# Patient Record
Sex: Female | Born: 1995
Health system: Southern US, Community
[De-identification: ages and names within clinical notes are randomized; demographics above are authoritative.]

## PROBLEM LIST (undated history)

## (undated) DIAGNOSIS — J45909 Unspecified asthma, uncomplicated: Secondary | ICD-10-CM

## (undated) DIAGNOSIS — G5600 Carpal tunnel syndrome, unspecified upper limb: Secondary | ICD-10-CM

## (undated) DIAGNOSIS — O1490 Unspecified pre-eclampsia, unspecified trimester: Secondary | ICD-10-CM

## (undated) DIAGNOSIS — E282 Polycystic ovarian syndrome: Secondary | ICD-10-CM

## (undated) HISTORY — PX: WISDOM TOOTH EXTRACTION: SHX21

## (undated) HISTORY — DX: Unspecified pre-eclampsia, unspecified trimester: O14.90

---
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
## 2014-05-10 DIAGNOSIS — G5600 Carpal tunnel syndrome, unspecified upper limb: Secondary | ICD-10-CM

## 2014-05-10 HISTORY — DX: Carpal tunnel syndrome, unspecified upper limb: G56.00

## 2015-05-11 DIAGNOSIS — E282 Polycystic ovarian syndrome: Secondary | ICD-10-CM

## 2015-05-11 HISTORY — DX: Polycystic ovarian syndrome: E28.2

## 2018-03-16 ENCOUNTER — Ambulatory Visit: Payer: Self-pay | Admitting: Physician Assistant

## 2018-11-16 ENCOUNTER — Encounter (HOSPITAL_COMMUNITY): Payer: Self-pay | Admitting: *Deleted

## 2018-11-16 ENCOUNTER — Inpatient Hospital Stay (HOSPITAL_COMMUNITY)
Admission: AD | Admit: 2018-11-16 | Discharge: 2018-11-16 | Disposition: A | Discharge status: Home / Self Care | Payer: Medicaid Other | Attending: Obstetrics and Gynecology | Admitting: Obstetrics and Gynecology

## 2018-11-16 ENCOUNTER — Inpatient Hospital Stay (HOSPITAL_COMMUNITY): Payer: Medicaid Other

## 2018-11-16 DIAGNOSIS — Z880 Allergy status to penicillin: Secondary | ICD-10-CM | POA: Insufficient documentation

## 2018-11-16 DIAGNOSIS — R109 Unspecified abdominal pain: Secondary | ICD-10-CM

## 2018-11-16 DIAGNOSIS — R1013 Epigastric pain: Secondary | ICD-10-CM | POA: Insufficient documentation

## 2018-11-16 DIAGNOSIS — Z3A01 Less than 8 weeks gestation of pregnancy: Secondary | ICD-10-CM | POA: Insufficient documentation

## 2018-11-16 DIAGNOSIS — O26891 Other specified pregnancy related conditions, first trimester: Secondary | ICD-10-CM | POA: Diagnosis not present

## 2018-11-16 DIAGNOSIS — O99331 Smoking (tobacco) complicating pregnancy, first trimester: Secondary | ICD-10-CM | POA: Insufficient documentation

## 2018-11-16 DIAGNOSIS — O360910 Maternal care for other rhesus isoimmunization, first trimester, not applicable or unspecified: Secondary | ICD-10-CM

## 2018-11-16 DIAGNOSIS — O26899 Other specified pregnancy related conditions, unspecified trimester: Secondary | ICD-10-CM

## 2018-11-16 DIAGNOSIS — Z6790 Unspecified blood type, Rh positive: Secondary | ICD-10-CM

## 2018-11-16 HISTORY — DX: Unspecified asthma, uncomplicated: J45.909

## 2018-11-16 LAB — COMPREHENSIVE METABOLIC PANEL
ALT: 18 U/L (ref 0–44)
AST: 19 U/L (ref 15–41)
Albumin: 4.1 g/dL (ref 3.5–5.0)
Alkaline Phosphatase: 52 U/L (ref 38–126)
Anion gap: 12 (ref 5–15)
BUN: 5 mg/dL — ABNORMAL LOW (ref 6–20)
CO2: 20 mmol/L — ABNORMAL LOW (ref 22–32)
Calcium: 9 mg/dL (ref 8.9–10.3)
Chloride: 105 mmol/L (ref 98–111)
Creatinine, Ser: 0.64 mg/dL (ref 0.44–1.00)
GFR calc Af Amer: >60 mL/min (ref >60)
GFR calc non Af Amer: >60 mL/min (ref >60)
Glucose, Bld: 81 mg/dL (ref 70–99)
Potassium: 3.4 mmol/L — ABNORMAL LOW (ref 3.5–5.1)
Sodium: 137 mmol/L (ref 135–145)
Total Bilirubin: 0.7 mg/dL (ref 0.3–1.2)
Total Protein: 6.8 g/dL (ref 6.5–8.1)

## 2018-11-16 LAB — URINALYSIS, ROUTINE W REFLEX MICROSCOPIC
Bilirubin Urine: NEGATIVE
Glucose, UA: NEGATIVE mg/dL
Hgb urine dipstick: NEGATIVE
Ketones, ur: 20 mg/dL — AB
Leukocytes,Ua: NEGATIVE
Nitrite: NEGATIVE
Protein, ur: 30 mg/dL — AB
Specific Gravity, Urine: 1.026 (ref 1.005–1.030)
pH: 5 (ref 5.0–8.0)

## 2018-11-16 LAB — HCG, QUANTITATIVE, PREGNANCY: hCG, Beta Chain, Quant, S: 195214 m[IU]/mL — ABNORMAL HIGH (ref <5)

## 2018-11-16 LAB — ABO/RH: ABO/RH(D): O POS

## 2018-11-16 LAB — CBC
HCT: 37 % (ref 36.0–46.0)
Hemoglobin: 12.9 g/dL (ref 12.0–15.0)
MCH: 29.7 pg (ref 26.0–34.0)
MCHC: 34.9 g/dL (ref 30.0–36.0)
MCV: 85.1 fL (ref 80.0–100.0)
Platelets: 342 10*3/uL (ref 150–400)
RBC: 4.35 MIL/uL (ref 3.87–5.11)
RDW: 13.4 % (ref 11.5–15.5)
WBC: 13.1 10*3/uL — ABNORMAL HIGH (ref 4.0–10.5)
nRBC: 0 % (ref 0.0–0.2)

## 2018-11-16 LAB — AMYLASE: Amylase: 37 U/L (ref 28–100)

## 2018-11-16 LAB — POCT PREGNANCY, URINE: Preg Test, Ur: POSITIVE — AB

## 2018-11-16 LAB — LIPASE, BLOOD: Lipase: 24 U/L (ref 11–51)

## 2018-11-16 NOTE — Discharge Instructions (Signed)
Abdominal Pain During Pregnancy  Abdominal pain is common during pregnancy, and has many possible causes. Some causes are more serious than others, and sometimes the cause is not known. Abdominal pain can be a sign that labor is starting. It can also be caused by normal growth and stretching of muscles and ligaments during pregnancy. Always tell your health care provider if you have any abdominal pain. Follow these instructions at home:  Do not have sex or put anything in your vagina until your pain goes away completely.  Get plenty of rest until your pain improves.  Drink enough fluid to keep your urine pale yellow.  Take over-the-counter and prescription medicines only as told by your health care provider.  Keep all follow-up visits as told by your health care provider. This is important. Contact a health care provider if:  Your pain continues or gets worse after resting.  You have lower abdominal pain that: ? Comes and goes at regular intervals. ? Spreads to your back. ? Is similar to menstrual cramps.  You have pain or burning when you urinate. Get help right away if:  You have a fever or chills.  You have vaginal bleeding.  You are leaking fluid from your vagina.  You are passing tissue from your vagina.  You have vomiting or diarrhea that lasts for more than 24 hours.  Your baby is moving less than usual.  You feel very weak or faint.  You have shortness of breath.  You develop severe pain in your upper abdomen. Summary  Abdominal pain is common during pregnancy, and has many possible causes.  If you experience abdominal pain during pregnancy, tell your health care provider right away.  Follow your health care provider's home care instructions and keep all follow-up visits as directed. This information is not intended to replace advice given to you by your health care provider. Make sure you discuss any questions you have with your health care  provider. Document Released: 04/26/2005 Document Revised: 08/14/2018 Document Reviewed: 07/29/2016 Elsevier Patient Education  2020 [employer]                   Safe Medications in Pregnancy    Acne: Benzoyl Peroxide Salicylic Acid  Backache/Headache: Tylenol: 2 regular strength every 4 hours OR              2 Extra strength every 6 hours  Colds/Coughs/Allergies: Benadryl (alcohol free) 25 mg every 6 hours as needed Breath right strips Claritin Cepacol throat lozenges Chloraseptic throat spray Cold-Eeze- up to three times per day Cough drops, alcohol free Flonase (by prescription only) Guaifenesin Mucinex Robitussin DM (plain only, alcohol free) Saline nasal spray/drops Sudafed (pseudoephedrine) & Actifed ** use only after [redacted] weeks gestation and if you do not have high blood pressure Tylenol Vicks Vaporub Zinc lozenges Zyrtec   Constipation: Colace Ducolax suppositories Fleet enema Glycerin suppositories Metamucil Milk of magnesia Miralax Senokot Smooth move tea  Diarrhea: Kaopectate Imodium A-D  *NO pepto Bismol  Hemorrhoids: Anusol Anusol HC Preparation H Tucks  Indigestion: Tums Maalox Mylanta Zantac  Pepcid  Insomnia: Benadryl (alcohol free) 25mg  every 6 hours as needed Tylenol PM Unisom, no Gelcaps  Leg Cramps: Tums MagGel  Nausea/Vomiting:  Bonine Dramamine Emetrol Ginger extract Sea bands Meclizine  Nausea medication to take during pregnancy:  Unisom (doxylamine succinate 25 mg tablets) Take one tablet daily at bedtime. If symptoms are not adequately controlled, the dose can be increased to a maximum recommended dose of two  tablets daily (1/2 tablet in the morning, 1/2 tablet mid-afternoon and one at bedtime). Vitamin B6 100mg  tablets. Take one tablet twice a day (up to 200 mg per day).  Skin Rashes: Aveeno products Benadryl cream or 25mg  every 6 hours as needed Calamine Lotion 1% cortisone cream  Yeast  infection: Gyne-lotrimin 7 Monistat 7   **If taking multiple medications, please check labels to avoid duplicating the same active ingredients **take medication as directed on the label ** Do not exceed 4000 mg of tylenol in 24 hours **Do not take medications that contain aspirin or ibuprofen

## 2018-11-16 NOTE — MAU Provider Note (Signed)
History     CSN: [id number]  Arrival date and time: 11/16/18 1452   First Provider Initiated Contact with Patient 11/16/18 1607      Chief Complaint  Patient presents with  . Abdominal Pain   Ms. Christie LimerickHolly Yates is a 23 y.o. G1P0 at 1670w6d who presents to MAU for abdominal pain. Pt reports eating chicken pot pie for dinner last night. Pt denies hx of gallbladder issues/pancreatitis. Pt denies ever feeling this type of pain in the past.  Onset: last night around 1800 Location: epigastric region Duration: <24hrs Character: pain only present with palpation or forceful movements - "feels like a sharp pain of pressure," without palpation or force feels like a dull ache, intermittent Aggravating/Associated: palpation, forceful movements/none Relieving: rest Treatment: warm bath, pt reports helped a little Severity: 6/10 at worst, at rest 1/10  Pt denies VB, vaginal discharge/odor/itching. Pt denies N/V, constipation, diarrhea, or urinary problems. Pt denies fever, chills, fatigue, sweating or changes in appetite. Pt denies SOB or chest pain. Pt denies dizziness, HA, light-headedness, weakness.  Problems this pregnancy include: pt has not yet been seen. Allergies? PCN, sulfa Current medications/supplements? B6, PNVs Prenatal care provider? [facility], next appt 11/17/2018   OB History    Gravida  1   Para      Term      Preterm      AB      Living        SAB      TAB      Ectopic      Multiple      Live Births              Past Medical History:  Diagnosis Date  . Asthma   . Carpal tunnel syndrome 2016  . PCOS (polycystic ovarian syndrome) 2017    History reviewed. No pertinent surgical history.  Family History  Problem Relation Age of Onset  . Multiple sclerosis Mother   . Crohn's disease Mother   . Cancer Father     Social History   Tobacco Use  . Smoking status: Current Every Day Smoker  . Smokeless tobacco: Never Used  Substance Use Topics  .  Alcohol use: Never    Frequency: Never  . Drug use: Never    Allergies:  Allergies  Allergen Reactions  . Penicillins Other (See Comments)    Vomiting and pooping blood  . Sulfa Antibiotics Swelling    Medications Prior to Admission  Medication Sig Dispense Refill Last Dose  . Prenatal Vit-Fe Fumarate-FA (MULTIVITAMIN-PRENATAL) 27-0.8 MG TABS tablet Take 1 tablet by mouth daily at 12 noon.   11/15/2018 at Unknown time  . vitamin B-6 (PYRIDOXINE) 25 MG tablet Take 25 mg by mouth daily.   11/15/2018 at Unknown time    Review of Systems  Constitutional: Negative for chills, diaphoresis, fatigue and fever.  Respiratory: Negative for shortness of breath.   Cardiovascular: Negative for chest pain.  Gastrointestinal: Positive for abdominal pain. Negative for constipation, diarrhea, nausea and vomiting.  Genitourinary: Negative for dysuria, flank pain, frequency, pelvic pain, urgency, vaginal bleeding and vaginal discharge.  Neurological: Negative for dizziness, weakness, light-headedness and headaches.   Physical Exam   Blood pressure (!) 141/71, pulse 92, temperature 98.4 F (36.9 C), resp. rate 16, height 5\' 3"  (1.6 m), weight 95.7 kg, last menstrual period 09/22/2018.  Patient Vitals for the past 24 hrs:  BP Temp Pulse Resp Height Weight  11/16/18 1539 (!) 141/71 - 92 - - -  11/16/18 1518 (!) 144/78 98.4 F (36.9 C) 99 16 5\' 3"  (1.6 m) 95.7 kg   Physical Exam  Constitutional: She is oriented to person, place, and time. She appears well-developed and well-nourished. No distress.  HENT:  Head: Normocephalic and atraumatic.  Respiratory: Effort normal.  GI: Soft. She exhibits no distension and no mass. There is abdominal tenderness in the epigastric area. There is no rigidity, no rebound, no guarding, no tenderness at McBurney's point and negative Murphy's sign.  Neurological: She is alert and oriented to person, place, and time.  Skin: Skin is warm and dry. She is not diaphoretic.   Psychiatric: She has a normal mood and affect. Her behavior is normal. Judgment and thought content normal.   Results for orders placed or performed during the hospital encounter of 11/16/18 (from the past 24 hour(s))  Pregnancy, urine POC     Status: Abnormal   Collection Time: 11/16/18  3:25 PM  Result Value Ref Range   Preg Test, Ur POSITIVE (A) NEGATIVE  Urinalysis, Routine w reflex microscopic     Status: Abnormal   Collection Time: 11/16/18  3:29 PM  Result Value Ref Range   Color, Urine YELLOW YELLOW   APPearance CLEAR CLEAR   Specific Gravity, Urine 1.026 1.005 - 1.030   pH 5.0 5.0 - 8.0   Glucose, UA NEGATIVE NEGATIVE mg/dL   Hgb urine dipstick NEGATIVE NEGATIVE   Bilirubin Urine NEGATIVE NEGATIVE   Ketones, ur 20 (A) NEGATIVE mg/dL   Protein, ur 30 (A) NEGATIVE mg/dL   Nitrite NEGATIVE NEGATIVE   Leukocytes,Ua NEGATIVE NEGATIVE   RBC / HPF 0-5 0 - 5 RBC/hpf   WBC, UA 0-5 0 - 5 WBC/hpf   Bacteria, UA RARE (A) NONE SEEN   Squamous Epithelial / LPF 0-5 0 - 5   Mucus PRESENT   CBC     Status: Abnormal   Collection Time: 11/16/18  4:17 PM  Result Value Ref Range   WBC 13.1 (H) 4.0 - 10.5 K/uL   RBC 4.35 3.87 - 5.11 MIL/uL   Hemoglobin 12.9 12.0 - 15.0 g/dL   HCT 40.937.0 81.136.0 - 91.446.0 %   MCV 85.1 80.0 - 100.0 fL   MCH 29.7 26.0 - 34.0 pg   MCHC 34.9 30.0 - 36.0 g/dL   RDW 78.213.4 95.611.5 - 21.315.5 %   Platelets 342 150 - 400 K/uL   nRBC 0.0 0.0 - 0.2 %  hCG, quantitative, pregnancy     Status: Abnormal   Collection Time: 11/16/18  4:17 PM  Result Value Ref Range   hCG, Beta Chain, Quant, S 195,214 (H) <5 mIU/mL  ABO/Rh     Status: None   Collection Time: 11/16/18  4:17 PM  Result Value Ref Range   ABO/RH(D) O POS    No rh immune globuloin      NOT A RH IMMUNE GLOBULIN CANDIDATE, PT RH POSITIVE Performed at [facility], [address] [place], [place] [address]   Comprehensive metabolic panel     Status: Abnormal   Collection Time: 11/16/18  4:17 PM  Result Value  Ref Range   Sodium 137 135 - 145 mmol/L   Potassium 3.4 (L) 3.5 - 5.1 mmol/L   Chloride 105 98 - 111 mmol/L   CO2 20 (L) 22 - 32 mmol/L   Glucose, Bld 81 70 - 99 mg/dL   BUN 5 (L) 6 - 20 mg/dL   Creatinine, Ser 8.460.64 0.44 - 1.00 mg/dL   Calcium  9.0 8.9 - 10.3 mg/dL   Total Protein 6.8 6.5 - 8.1 g/dL   Albumin 4.1 3.5 - 5.0 g/dL   AST 19 15 - 41 U/L   ALT 18 0 - 44 U/L   Alkaline Phosphatase 52 38 - 126 U/L   Total Bilirubin 0.7 0.3 - 1.2 mg/dL   GFR calc non Af Amer >60 >60 mL/min   GFR calc Af Amer >60 >60 mL/min   Anion gap 12 5 - 15  Amylase     Status: None   Collection Time: 11/16/18  4:17 PM  Result Value Ref Range   Amylase 37 28 - 100 U/L  Lipase, blood     Status: None   Collection Time: 11/16/18  4:17 PM  Result Value Ref Range   Lipase 24 11 - 51 U/L   [place] Abdomen Complete  Result Date: 11/16/2018 CLINICAL DATA:  Seven weeks pregnant.  Abdominal pain. EXAM: ABDOMEN ULTRASOUND COMPLETE COMPARISON:  None. FINDINGS: Gallbladder: No gallstones or wall thickening visualized. No sonographic Murphy sign noted by sonographer. Common bile duct: Diameter: 2 mm Liver: No focal lesion identified. Within normal limits in parenchymal echogenicity. Portal vein is patent on color Doppler imaging with normal direction of blood flow towards the liver. IVC: No abnormality visualized. Pancreas: Visualized portion unremarkable. Spleen: Size and appearance within normal limits. Right Kidney: Length: 11.5 cm. Echogenicity within normal limits. No mass or hydronephrosis visualized. Left Kidney: Length: 11.3 cm. Echogenicity within normal limits. No mass or hydronephrosis visualized. Abdominal aorta: No aneurysm visualized. Other findings: None. IMPRESSION: Normal abdomen ultrasound. Electronically Signed   By: Bary RichardStan  Maynard M.D.   On: 11/16/2018 17:57    MAU Course  Procedures  MDM -epigastric pain with palpation/forceful movement -UA: 20ketones/30PRO/rare bacteria -CBC: WNL for pregnancy (WBCs  13.1) -Abdominal US: pancreas not well-visualized, otherwise WNL -hCG: 161,[id number] -ABO: O Positive -CMP: no abnormalities requiring treatment (AST/ALT 19/18) -Amylase: WNL (37) -Lipase: WNL (24) -called and spoke with Dr. Debroah LoopArnold @1804 . Per Dr. Debroah LoopArnold, ectopic work-up does not need to be completed and pt can be discharged to home with precautions on worsening or new symptoms -pt discharged to home in stable condition  Orders Placed This Encounter  Procedures  . US Abdomen Complete    Standing Status:   Standing    Number of Occurrences:   1    Order Specific Question:   Symptom/Reason for Exam    Answer:   Epigastric pain during pregnancy, antepartum [[id number]][[id number]]  . Urinalysis, Routine w reflex microscopic    Standing Status:   Standing    Number of Occurrences:   1  . CBC    Standing Status:   Standing    Number of Occurrences:   1  . hCG, quantitative, pregnancy    Standing Status:   Standing    Number of Occurrences:   1  . Comprehensive metabolic panel    Standing Status:   Standing    Number of Occurrences:   1  . Amylase    Standing Status:   Standing    Number of Occurrences:   1  . Lipase, blood    Standing Status:   Standing    Number of Occurrences:   1  . Pregnancy, urine POC    Standing Status:   Standing    Number of Occurrences:   1  . ABO/Rh    Standing Status:   Standing    Number of Occurrences:   1  . Discharge patient  Order Specific Question:   Discharge disposition    Answer:   01-Home or Self Care [1]    Order Specific Question:   Discharge patient date    Answer:   11/16/2018   No orders of the defined types were placed in this encounter.  Assessment and Plan   1. Epigastric pain during pregnancy, antepartum   2. Abdominal pain in pregnancy   3. [redacted] weeks gestation of pregnancy   4. Blood type, Rh positive    Allergies as of 11/16/2018      Reactions   Penicillins Other (See Comments)   Vomiting and pooping blood   Sulfa Antibiotics Swelling       Medication List    TAKE these medications   multivitamin-prenatal 27-0.8 MG Tabs tablet Take 1 tablet by mouth daily at 12 noon.   vitamin B-6 25 MG tablet Commonly known as: pyridOXINE Take 25 mg by mouth daily.      -pt advised to reduce dietary fat -strict worsening of symptoms/pain/bleeding/N/V/hyperemesis/return MAU precautions discussed -pt to keep appt tomorrow to start OB care @[facility]  -pt discharged to home in stable condition  Christie Yates 11/16/2018, 6:42 PM

## 2018-11-16 NOTE — MAU Note (Signed)
.   Christie Yates is a 23 y.o. at [redacted]w[redacted]d here in MAU reporting: pain in the upper part of her abdomen that is sore to touch since last night. Denies any vaginal bleeding or abnormal discharge LMP: 09/22/18 Onset of complaint: last night Pain score: 6 Vitals:   11/16/18 1518  BP: (!) 144/78  Pulse: 99  Resp: 16  Temp: 98.4 F (36.9 C)      Lab orders placed from triage: UA

## 2018-11-28 ENCOUNTER — Other Ambulatory Visit (HOSPITAL_COMMUNITY): Payer: Self-pay | Admitting: Family

## 2018-11-28 DIAGNOSIS — Z3A13 13 weeks gestation of pregnancy: Secondary | ICD-10-CM

## 2018-11-28 DIAGNOSIS — Z3682 Encounter for antenatal screening for nuchal translucency: Secondary | ICD-10-CM

## 2018-12-05 ENCOUNTER — Ambulatory Visit (HOSPITAL_COMMUNITY): Payer: Medicaid Other | Admitting: *Deleted

## 2018-12-05 ENCOUNTER — Ambulatory Visit (HOSPITAL_COMMUNITY)
Admission: RE | Admit: 2018-12-05 | Discharge: 2018-12-05 | Disposition: A | Discharge status: Home / Self Care | Payer: Medicaid Other | Source: Ambulatory Visit | Attending: Obstetrics and Gynecology | Admitting: Obstetrics and Gynecology

## 2018-12-05 ENCOUNTER — Other Ambulatory Visit: Payer: Self-pay

## 2018-12-05 ENCOUNTER — Encounter (HOSPITAL_COMMUNITY): Payer: Self-pay | Admitting: *Deleted

## 2018-12-05 ENCOUNTER — Ambulatory Visit (HOSPITAL_COMMUNITY): Payer: Medicaid Other

## 2018-12-05 VITALS — BP 123/66 | HR 110 | Temp 99.0°F | Wt 216.0 lb

## 2018-12-05 DIAGNOSIS — O99331 Smoking (tobacco) complicating pregnancy, first trimester: Secondary | ICD-10-CM

## 2018-12-05 DIAGNOSIS — Z3682 Encounter for antenatal screening for nuchal translucency: Secondary | ICD-10-CM | POA: Insufficient documentation

## 2018-12-05 DIAGNOSIS — Z3A11 11 weeks gestation of pregnancy: Secondary | ICD-10-CM

## 2018-12-05 DIAGNOSIS — Z369 Encounter for antenatal screening, unspecified: Secondary | ICD-10-CM

## 2018-12-05 DIAGNOSIS — Z3A13 13 weeks gestation of pregnancy: Secondary | ICD-10-CM | POA: Insufficient documentation

## 2018-12-05 DIAGNOSIS — O99321 Drug use complicating pregnancy, first trimester: Secondary | ICD-10-CM

## 2018-12-05 DIAGNOSIS — O99211 Obesity complicating pregnancy, first trimester: Secondary | ICD-10-CM

## 2018-12-08 LAB — FIRST TRIMESTER SCREEN W/NT
CRL: 47 mm
DIA MoM: 2.8
DIA Value: 614.7 pg/mL
Gest Age-Collect: 11.3 weeks
Maternal Age At EDD: 24.5 yr
Nuchal Translucency MoM: 0.99
Nuchal Translucency: 1.2 mm
Number of Fetuses: 1
PAPP-A MoM: 0.66
PAPP-A Value: 259.6 ng/mL
Test Results:: NEGATIVE
Weight: 220 [lb_av]
hCG MoM: 2.03
hCG Value: 180.3 IU/mL

## 2018-12-11 ENCOUNTER — Telehealth (HOSPITAL_COMMUNITY): Payer: Self-pay | Admitting: Genetic Counselor

## 2018-12-11 NOTE — Telephone Encounter (Signed)
LVM for Ms. Hollen re: her negative first trimester screen results. Based on results from the screen, the risk for her pregnancy to be affected by Down syndrome increased slightly from her 1 in 967 age-related risk to 1 in 830, but still falls well below the screen cutoff of 1 in 250. The risk for trisomy 18 decreased from her 1 in 1892 age-related risk to <1 in 10,000. Ms. Celestin was reminded that while this screen significantly reduces the likelihood of the pregnancy being affected by trisomy 62 or trisomy 48, it cannot be considered diagnostic. Diagnostic testing via [facility] or amniocentesis is available should she be interested in pursuing this. I left my name and contact information and encouraged Ms. Fogg to contact me if she has any questions about these results.  Buelah Manis, MS Genetic Counselor

## 2019-02-05 ENCOUNTER — Other Ambulatory Visit (HOSPITAL_COMMUNITY): Payer: Self-pay | Admitting: Nurse Practitioner

## 2019-02-05 DIAGNOSIS — Z363 Encounter for antenatal screening for malformations: Secondary | ICD-10-CM

## 2019-02-12 ENCOUNTER — Other Ambulatory Visit: Payer: Self-pay

## 2019-02-12 ENCOUNTER — Inpatient Hospital Stay (HOSPITAL_COMMUNITY)
Admission: AD | Admit: 2019-02-12 | Discharge: 2019-02-12 | Disposition: A | Discharge status: Home / Self Care | Payer: Medicaid Other | Attending: Obstetrics and Gynecology | Admitting: Obstetrics and Gynecology

## 2019-02-12 DIAGNOSIS — O99332 Smoking (tobacco) complicating pregnancy, second trimester: Secondary | ICD-10-CM | POA: Insufficient documentation

## 2019-02-12 DIAGNOSIS — Z882 Allergy status to sulfonamides status: Secondary | ICD-10-CM | POA: Insufficient documentation

## 2019-02-12 DIAGNOSIS — N898 Other specified noninflammatory disorders of vagina: Secondary | ICD-10-CM | POA: Insufficient documentation

## 2019-02-12 DIAGNOSIS — Z3A21 21 weeks gestation of pregnancy: Secondary | ICD-10-CM | POA: Diagnosis not present

## 2019-02-12 DIAGNOSIS — N906 Unspecified hypertrophy of vulva: Secondary | ICD-10-CM | POA: Diagnosis not present

## 2019-02-12 DIAGNOSIS — O26892 Other specified pregnancy related conditions, second trimester: Secondary | ICD-10-CM | POA: Diagnosis not present

## 2019-02-12 DIAGNOSIS — Z880 Allergy status to penicillin: Secondary | ICD-10-CM | POA: Insufficient documentation

## 2019-02-12 LAB — URINALYSIS, ROUTINE W REFLEX MICROSCOPIC
Bilirubin Urine: NEGATIVE
Glucose, UA: NEGATIVE mg/dL
Hgb urine dipstick: NEGATIVE
Ketones, ur: NEGATIVE mg/dL
Leukocytes,Ua: NEGATIVE
Nitrite: NEGATIVE
Protein, ur: NEGATIVE mg/dL
Specific Gravity, Urine: 1.011 (ref 1.005–1.030)
pH: 6 (ref 5.0–8.0)

## 2019-02-12 LAB — WET PREP, GENITAL
Clue Cells Wet Prep HPF POC: NONE SEEN
Sperm: NONE SEEN
Trich, Wet Prep: NONE SEEN
WBC, Wet Prep HPF POC: NONE SEEN
Yeast Wet Prep HPF POC: NONE SEEN

## 2019-02-12 MED ORDER — NYSTATIN-TRIAMCINOLONE 100000-0.1 UNIT/GM-% EX OINT: 1.0000 "application " | TOPICAL_OINTMENT | Freq: Two times a day (BID) | CUTANEOUS | 0 refills | Status: DC

## 2019-02-12 MED ORDER — TRIAMCINOLONE ACETONIDE 0.025 % EX OINT: 1.0000 "application " | TOPICAL_OINTMENT | Freq: Two times a day (BID) | CUTANEOUS | 0 refills | Status: DC

## 2019-02-12 MED ORDER — NYSTATIN 100000 UNIT/GM EX OINT: 1.0000 "application " | TOPICAL_OINTMENT | Freq: Two times a day (BID) | CUTANEOUS | 0 refills | Status: DC

## 2019-02-12 NOTE — MAU Provider Note (Signed)
History     CSN: [id number]  Arrival date and time: 02/12/19 1215   None     Chief Complaint  Patient presents with  . vaginal discomfort   HPI   Ms.Christie Yates is a 23 y.o. female G1P0 @ [redacted]w[redacted]d here in MAU with vaginal discomfort. Says she is [facility] and was recently treated for a yeast infection. She used the cream for the prescribed 7 days and then used it ago to see if it would help. The discomfort is around the labia's and introitus.   OB History    Gravida  1   Para      Term      Preterm      AB      Living        SAB      TAB      Ectopic      Multiple      Live Births              Past Medical History:  Diagnosis Date  . Asthma   . Carpal tunnel syndrome 2016  . PCOS (polycystic ovarian syndrome) 2017    Past Surgical History:  Procedure Laterality Date  . WISDOM TOOTH EXTRACTION      Family History  Problem Relation Age of Onset  . Multiple sclerosis Mother   . Crohn's disease Mother   . Cancer Father     Social History   Tobacco Use  . Smoking status: Current Every Day Smoker  . Smokeless tobacco: Never Used  Substance Use Topics  . Alcohol use: Never    Frequency: Never  . Drug use: Never    Allergies:  Allergies  Allergen Reactions  . Penicillins Other (See Comments)    Vomiting and pooping blood  . Sulfa Antibiotics Swelling    No medications prior to admission.   Results for orders placed or performed during the hospital encounter of 02/12/19 (from the past 48 hour(s))  Urinalysis, Routine w reflex microscopic     Status: Abnormal   Collection Time: 02/12/19  2:01 PM  Result Value Ref Range   Color, Urine YELLOW YELLOW   APPearance HAZY (A) CLEAR   Specific Gravity, Urine 1.011 1.005 - 1.030   pH 6.0 5.0 - 8.0   Glucose, UA NEGATIVE NEGATIVE mg/dL   Hgb urine dipstick NEGATIVE NEGATIVE   Bilirubin Urine NEGATIVE NEGATIVE   Ketones, ur NEGATIVE NEGATIVE mg/dL   Protein, ur NEGATIVE  NEGATIVE mg/dL   Nitrite NEGATIVE NEGATIVE   Leukocytes,Ua NEGATIVE NEGATIVE    Comment: Performed at [facility], [address] [place], [place] [address]  Wet prep, genital     Status: None   Collection Time: 02/12/19  3:51 PM  Result Value Ref Range   Yeast Wet Prep HPF POC NONE SEEN NONE SEEN   Trich, Wet Prep NONE SEEN NONE SEEN   Clue Cells Wet Prep HPF POC NONE SEEN NONE SEEN   WBC, Wet Prep HPF POC NONE SEEN NONE SEEN   Sperm NONE SEEN     Comment: Performed at [facility], [address], [place], [place] [address]    Review of Systems  Gastrointestinal: Negative for abdominal pain.  Genitourinary: Negative for vaginal bleeding and vaginal discharge.   Physical Exam   Blood pressure 119/61, pulse 100, temperature 98.3 F (36.8 C), resp. rate 16, weight 98.9 kg, last menstrual period 09/22/2018, SpO2 100 %.  Physical Exam  Constitutional: She is oriented to person, place, and time. She appears well-developed and well-nourished. No distress.  HENT:  Head: Normocephalic.  Genitourinary:    Genitourinary Comments: Patient self swabbed d/t MAU wait time Vaginal exam shows labia hypertrophy, erythema noted at introitus. No thick white discharge, no lesions. Area inflamed likely 2/2 shaving.    Neurological: She is alert and oriented to person, place, and time.  Skin: She is not diaphoretic.   MAU Course  Procedures  None  MDM  Wet prep WNL UA  Assessment and Plan   A:  1. Vaginal irritation   2. Labial hypertrophy     P:  Discharge home in stable condition Rx: nystatin/ triamcinolone  F/u with ob No shaving, no soap  Rasch, Artist Pais, NP 02/13/2019 9:07 AM

## 2019-02-12 NOTE — MAU Note (Signed)
.   Christie Yates is a 23 y.o. at [redacted]w[redacted]d here in MAU reporting: that she has been using a vaginal cream for a yeast infection that the health dept prescribed and she feels like it is not working. Vaginal discomfort LMP: 09/22/18 Onset of complaint: 3 weeks  Pain score: 5 Vitals:   02/12/19 1317  BP: 135/80  Pulse: 100  Resp: 16  Temp: 98.3 F (36.8 C)  SpO2: 100%     FHT:150 Lab orders placed from triage: UA

## 2019-02-13 ENCOUNTER — Ambulatory Visit (HOSPITAL_COMMUNITY)
Admission: RE | Admit: 2019-02-13 | Discharge: 2019-02-13 | Disposition: A | Discharge status: Home / Self Care | Payer: Medicaid Other | Source: Ambulatory Visit | Attending: Obstetrics and Gynecology | Admitting: Obstetrics and Gynecology

## 2019-02-13 ENCOUNTER — Other Ambulatory Visit (HOSPITAL_COMMUNITY): Payer: Self-pay | Admitting: *Deleted

## 2019-02-13 ENCOUNTER — Other Ambulatory Visit (HOSPITAL_COMMUNITY): Payer: Self-pay | Admitting: Nurse Practitioner

## 2019-02-13 DIAGNOSIS — Z3A21 21 weeks gestation of pregnancy: Secondary | ICD-10-CM

## 2019-02-13 DIAGNOSIS — Z363 Encounter for antenatal screening for malformations: Secondary | ICD-10-CM

## 2019-02-13 DIAGNOSIS — O99212 Obesity complicating pregnancy, second trimester: Secondary | ICD-10-CM

## 2019-02-13 DIAGNOSIS — O99322 Drug use complicating pregnancy, second trimester: Secondary | ICD-10-CM

## 2019-02-15 ENCOUNTER — Emergency Department (HOSPITAL_COMMUNITY)
Admission: EM | Admit: 2019-02-15 | Discharge: 2019-02-15 | Disposition: A | Discharge status: Home / Self Care | Payer: Medicaid Other | Attending: Emergency Medicine | Admitting: Emergency Medicine

## 2019-02-15 ENCOUNTER — Encounter (HOSPITAL_COMMUNITY): Payer: Self-pay | Admitting: Emergency Medicine

## 2019-02-15 ENCOUNTER — Other Ambulatory Visit: Payer: Self-pay

## 2019-02-15 DIAGNOSIS — Z79899 Other long term (current) drug therapy: Secondary | ICD-10-CM | POA: Insufficient documentation

## 2019-02-15 DIAGNOSIS — J029 Acute pharyngitis, unspecified: Secondary | ICD-10-CM | POA: Diagnosis present

## 2019-02-15 DIAGNOSIS — J45909 Unspecified asthma, uncomplicated: Secondary | ICD-10-CM | POA: Diagnosis not present

## 2019-02-15 DIAGNOSIS — F17210 Nicotine dependence, cigarettes, uncomplicated: Secondary | ICD-10-CM | POA: Diagnosis not present

## 2019-02-15 DIAGNOSIS — Z20828 Contact with and (suspected) exposure to other viral communicable diseases: Secondary | ICD-10-CM | POA: Insufficient documentation

## 2019-02-15 LAB — GC/CHLAMYDIA PROBE AMP (~~LOC~~) NOT AT ARMC
Chlamydia: NEGATIVE
Neisseria Gonorrhea: NEGATIVE

## 2019-02-15 LAB — GROUP A STREP BY PCR: Group A Strep by PCR: NOT DETECTED

## 2019-02-15 NOTE — ED Triage Notes (Signed)
Patient reports recent exposure to someone who has strep throat. States she woke up this morning with sore and itchy throat. Denies other symptoms, no fevers/chills. [redacted] weeks pregnant, also reports Health Dept. suggested she receive COVID test even though no known exposure.

## 2019-02-15 NOTE — ED Provider Notes (Signed)
[facility] EMERGENCY DEPARTMENT Provider Note   CSN: [id number] Arrival date & time: 02/15/19  1203     History   Chief Complaint Chief Complaint  Patient presents with  . Sore Throat    HPI Christie Yates is a 23 y.o. female.     HPI   23 year old female presents today with complaints of sore throat.  Patient notes she woke up this morning with dry itchy sore throat.  She denies any nasal congestion rhinorrhea cough shortness of breath neck pain or fever.  Patient notes she is [redacted] weeks pregnant and was told she needed Cobra testing for this.  She denies any close exposures.  She denies any pregnancy related complications.  Past Medical History:  Diagnosis Date  . Asthma   . Carpal tunnel syndrome 2016  . PCOS (polycystic ovarian syndrome) 2017    There are no active problems to display for this patient.   Past Surgical History:  Procedure Laterality Date  . WISDOM TOOTH EXTRACTION       OB History    Gravida  1   Para      Term      Preterm      AB      Living        SAB      TAB      Ectopic      Multiple      Live Births               Home Medications    Prior to Admission medications   Medication Sig Start Date End Date Taking? Authorizing Provider  nystatin ointment (MYCOSTATIN) Apply 1 application topically 2 (two) times daily. 02/12/19   Rasch, Victorino Dike I, NP  Prenatal Vit-Fe Fumarate-FA (MULTIVITAMIN-PRENATAL) 27-0.8 MG TABS tablet Take 1 tablet by mouth daily at 12 noon.    [provider]  triamcinolone (KENALOG) 0.025 % ointment Apply 1 application topically 2 (two) times daily. 02/12/19   Rasch, Victorino Dike I, NP  vitamin B-6 (PYRIDOXINE) 25 MG tablet Take 25 mg by mouth daily.    [provider]    Family History Family History  Problem Relation Age of Onset  . Multiple sclerosis Mother   . Crohn's disease Mother   . Cancer Father     Social History Social History   Tobacco Use  .  Smoking status: Current Every Day Smoker  . Smokeless tobacco: Never Used  Substance Use Topics  . Alcohol use: Never    Frequency: Never  . Drug use: Never     Allergies   Penicillins and Sulfa antibiotics   Review of Systems Review of Systems  All other systems reviewed and are negative.    Physical Exam Updated Vital Signs BP (!) 124/94 (BP Location: Left Arm)   Pulse (!) 108   Temp 99.1 F (37.3 C) (Oral)   Resp 14   LMP 09/22/2018 (Approximate)   SpO2 98%   Physical Exam Vitals signs and nursing note reviewed.  Constitutional:      Appearance: She is well-developed.  HENT:     Head: Normocephalic and atraumatic.     Comments: Minimal erythema noted in the posterior oropharynx no exudate no tonsillar swelling or edema, no pooling secretions, voice is normal Eyes:     General: No scleral icterus.       Right eye: No discharge.        Left eye: No discharge.     Conjunctiva/sclera: Conjunctivae  normal.     Pupils: Pupils are equal, round, and reactive to light.  Neck:     Musculoskeletal: Normal range of motion.     Vascular: No JVD.     Trachea: No tracheal deviation.  Pulmonary:     Effort: Pulmonary effort is normal. No respiratory distress.     Breath sounds: Normal breath sounds. No stridor. No wheezing, rhonchi or rales.  Neurological:     Mental Status: She is alert and oriented to person, place, and time.     Coordination: Coordination normal.  Psychiatric:        Behavior: Behavior normal.        Thought Content: Thought content normal.        Judgment: Judgment normal.     ED Treatments / Results  Labs (all labs ordered are listed, but only abnormal results are displayed) Labs Reviewed  GROUP A STREP BY PCR  NOVEL CORONAVIRUS, NAA (HOSP ORDER, SEND-OUT TO REF LAB; TAT 18-24 HRS)    EKG None  Radiology No results found.  Procedures Procedures (including critical care time)  Medications Ordered in ED Medications - No data to  display   Initial Impression / Assessment and Plan / ED Course  I have reviewed the triage vital signs and the nursing notes.  Pertinent labs & imaging results that were available during my care of the patient were reviewed by me and considered in my medical decision making (see chart for details).        23 year old female presents today with sore throat.  She will be tested for COVID with negative strep here.  Discharged with return precautions.  Christie Yates was evaluated in Emergency Department on 02/15/2019 for the symptoms described in the history of present illness. She was evaluated in the context of the global COVID-19 pandemic, which necessitated consideration that the patient might be at risk for infection with the SARS-CoV-2 virus that causes COVID-19. Institutional protocols and algorithms that pertain to the evaluation of patients at risk for COVID-19 are in a state of rapid change based on information released by regulatory bodies including the CDC and federal and state organizations. These policies and algorithms were followed during the patient's care in the ED.   Final Clinical Impressions(s) / ED Diagnoses   Final diagnoses:  Pharyngitis, unspecified etiology    ED Discharge Orders    None       Christie Yates 02/15/19 1554    Carmin Muskrat, MD 02/15/19 2323

## 2019-02-15 NOTE — Discharge Instructions (Addendum)
Please read attached information. If you experience any new or worsening signs or symptoms please return to the emergency room for evaluation. Please follow-up with your primary care provider or specialist as discussed. Please use medication prescribed only as directed and discontinue taking if you have any concerning signs or symptoms.   °

## 2019-02-16 LAB — NOVEL CORONAVIRUS, NAA (HOSP ORDER, SEND-OUT TO REF LAB; TAT 18-24 HRS): SARS-CoV-2, NAA: NOT DETECTED

## 2019-03-27 ENCOUNTER — Other Ambulatory Visit: Payer: Self-pay

## 2019-03-27 ENCOUNTER — Encounter (HOSPITAL_COMMUNITY): Payer: Self-pay

## 2019-03-27 ENCOUNTER — Ambulatory Visit (HOSPITAL_COMMUNITY)
Admission: RE | Admit: 2019-03-27 | Discharge: 2019-03-27 | Disposition: A | Discharge status: Home / Self Care | Payer: Medicaid Other | Source: Ambulatory Visit | Attending: Obstetrics and Gynecology | Admitting: Obstetrics and Gynecology

## 2019-03-27 ENCOUNTER — Ambulatory Visit (HOSPITAL_COMMUNITY): Payer: Medicaid Other | Admitting: *Deleted

## 2019-03-27 ENCOUNTER — Other Ambulatory Visit (HOSPITAL_COMMUNITY): Payer: Self-pay | Admitting: *Deleted

## 2019-03-27 VITALS — BP 134/81 | HR 105 | Temp 98.9°F

## 2019-03-27 DIAGNOSIS — O0990 Supervision of high risk pregnancy, unspecified, unspecified trimester: Secondary | ICD-10-CM | POA: Diagnosis present

## 2019-03-27 DIAGNOSIS — O99212 Obesity complicating pregnancy, second trimester: Secondary | ICD-10-CM | POA: Diagnosis not present

## 2019-03-27 DIAGNOSIS — O99213 Obesity complicating pregnancy, third trimester: Secondary | ICD-10-CM

## 2019-03-27 DIAGNOSIS — Z362 Encounter for other antenatal screening follow-up: Secondary | ICD-10-CM | POA: Diagnosis not present

## 2019-03-27 DIAGNOSIS — Z3A27 27 weeks gestation of pregnancy: Secondary | ICD-10-CM | POA: Diagnosis not present

## 2019-05-08 ENCOUNTER — Ambulatory Visit (HOSPITAL_COMMUNITY): Payer: Medicaid Other | Admitting: *Deleted

## 2019-05-08 ENCOUNTER — Encounter (HOSPITAL_COMMUNITY): Payer: Self-pay

## 2019-05-08 ENCOUNTER — Other Ambulatory Visit: Payer: Self-pay

## 2019-05-08 ENCOUNTER — Ambulatory Visit (HOSPITAL_COMMUNITY)
Admission: RE | Admit: 2019-05-08 | Discharge: 2019-05-08 | Disposition: A | Discharge status: Home / Self Care | Payer: Medicaid Other | Source: Ambulatory Visit | Attending: Obstetrics and Gynecology | Admitting: Obstetrics and Gynecology

## 2019-05-08 VITALS — BP 125/77 | HR 97 | Temp 98.0°F

## 2019-05-08 DIAGNOSIS — Z3A33 33 weeks gestation of pregnancy: Secondary | ICD-10-CM

## 2019-05-08 DIAGNOSIS — O0990 Supervision of high risk pregnancy, unspecified, unspecified trimester: Secondary | ICD-10-CM | POA: Diagnosis present

## 2019-05-08 DIAGNOSIS — O99213 Obesity complicating pregnancy, third trimester: Secondary | ICD-10-CM | POA: Diagnosis not present

## 2019-05-08 DIAGNOSIS — Z362 Encounter for other antenatal screening follow-up: Secondary | ICD-10-CM | POA: Diagnosis not present

## 2019-05-21 ENCOUNTER — Emergency Department (HOSPITAL_COMMUNITY)
Admission: EM | Admit: 2019-05-21 | Discharge: 2019-05-21 | Disposition: A | Discharge status: Home / Self Care | Payer: Medicaid Other | Attending: Emergency Medicine | Admitting: Emergency Medicine

## 2019-05-21 ENCOUNTER — Encounter (HOSPITAL_COMMUNITY): Payer: Self-pay | Admitting: Emergency Medicine

## 2019-05-21 DIAGNOSIS — R05 Cough: Secondary | ICD-10-CM | POA: Insufficient documentation

## 2019-05-21 DIAGNOSIS — Z87891 Personal history of nicotine dependence: Secondary | ICD-10-CM | POA: Diagnosis not present

## 2019-05-21 DIAGNOSIS — R0981 Nasal congestion: Secondary | ICD-10-CM | POA: Diagnosis not present

## 2019-05-21 DIAGNOSIS — Z3A35 35 weeks gestation of pregnancy: Secondary | ICD-10-CM | POA: Insufficient documentation

## 2019-05-21 DIAGNOSIS — R059 Cough, unspecified: Secondary | ICD-10-CM

## 2019-05-21 DIAGNOSIS — J45909 Unspecified asthma, uncomplicated: Secondary | ICD-10-CM | POA: Diagnosis not present

## 2019-05-21 DIAGNOSIS — Z79899 Other long term (current) drug therapy: Secondary | ICD-10-CM | POA: Diagnosis not present

## 2019-05-21 DIAGNOSIS — Z20822 Contact with and (suspected) exposure to covid-19: Secondary | ICD-10-CM | POA: Insufficient documentation

## 2019-05-21 DIAGNOSIS — O99513 Diseases of the respiratory system complicating pregnancy, third trimester: Secondary | ICD-10-CM | POA: Diagnosis present

## 2019-05-21 LAB — GROUP A STREP BY PCR: Group A Strep by PCR: NOT DETECTED

## 2019-05-21 LAB — SARS CORONAVIRUS 2 (TAT 6-24 HRS): SARS Coronavirus 2: NEGATIVE

## 2019-05-21 LAB — OB RESULTS CONSOLE GBS: GBS: NEGATIVE

## 2019-05-21 NOTE — ED Provider Notes (Signed)
[facility] EMERGENCY DEPARTMENT Provider Note   CSN: [id number] Arrival date & time: 05/21/19  1236     History Chief Complaint  Patient presents with  . Sore Throat  . Nasal Congestion    Christie Yates is a 24 y.o. female who is G1, P0 at [redacted] weeks pregnant.  She presents to the emergency department with chief complaint of cough and congestion.  Patient states she has had 1 week of nasal congestion but began having some sore throat and productive cough starting 2 days ago.  She states that last night she had phlegm in her throat that made her feel like she was choking.  She did work really hard to expectorate it and coughed so hard that she almost vomited.  She denies any shortness of breath, fevers, loss of sense of taste or smell, body aches, chills.  HPI     Past Medical History:  Diagnosis Date  . Asthma   . Carpal tunnel syndrome 2016  . PCOS (polycystic ovarian syndrome) 2017    There are no problems to display for this patient.   Past Surgical History:  Procedure Laterality Date  . WISDOM TOOTH EXTRACTION       OB History    Gravida  1   Para      Term      Preterm      AB      Living        SAB      TAB      Ectopic      Multiple      Live Births              Family History  Problem Relation Age of Onset  . Multiple sclerosis Mother   . Crohn's disease Mother   . Cancer Father     Social History   Tobacco Use  . Smoking status: Former Smoker    Quit date: 04/17/2019    Years since quitting: 0.0  . Smokeless tobacco: Never Used  Substance Use Topics  . Alcohol use: Never  . Drug use: Never    Home Medications Prior to Admission medications   Medication Sig Start Date End Date Taking? Authorizing Provider  acetaminophen (TYLENOL) 325 MG tablet Take 325 mg by mouth every 6 (six) hours as needed for mild pain.   Yes [provider]  Doxylamine Succinate, Sleep, (UNISOM PO) Take 1 tablet by mouth at  bedtime as needed (sleep).    Yes [provider]  guaifenesin (ROBITUSSIN) 100 MG/5ML syrup Take 200 mg by mouth 3 (three) times daily as needed for cough.   Yes [provider]  Prenatal Vit-Fe Fumarate-FA (MULTIVITAMIN-PRENATAL) 27-0.8 MG TABS tablet Take 1 tablet by mouth daily at 12 noon.   Yes [provider]  vitamin B-6 (PYRIDOXINE) 25 MG tablet Take 25 mg by mouth daily.   Yes [provider]  nystatin ointment (MYCOSTATIN) Apply 1 application topically 2 (two) times daily. Patient not taking: Reported on 03/27/2019 02/12/19   Rasch, Anderson Malta I, NP  triamcinolone (KENALOG) 0.025 % ointment Apply 1 application topically 2 (two) times daily. Patient not taking: Reported on 03/27/2019 02/12/19   Rasch, Anderson Malta I, NP    Allergies    Penicillins and Sulfa antibiotics  Review of Systems   Review of Systems Ten systems reviewed and are negative for acute change, except as noted in the HPI.   Physical Exam Updated Vital Signs BP 119/79 (BP Location:  Left Arm)   Pulse (!) 105   Temp 98.3 F (36.8 C) (Oral)   Resp 18   LMP 09/22/2018 (Approximate)   SpO2 98%   Physical Exam Vitals and nursing note reviewed.  Constitutional:      General: She is not in acute distress.    Appearance: She is well-developed. She is not diaphoretic.  HENT:     Head: Normocephalic and atraumatic.     Mouth/Throat:     Mouth: Mucous membranes are moist.     Pharynx: Posterior oropharyngeal erythema present.  Eyes:     General: No scleral icterus.    Conjunctiva/sclera: Conjunctivae normal.  Cardiovascular:     Rate and Rhythm: Normal rate and regular rhythm.     Heart sounds: Normal heart sounds. No murmur. No friction rub. No gallop.   Pulmonary:     Effort: Pulmonary effort is normal. No respiratory distress.     Breath sounds: Normal breath sounds.  Abdominal:     General: Bowel sounds are normal. There is no distension.     Palpations: Abdomen is soft.  There is no mass.     Tenderness: There is no abdominal tenderness. There is no guarding.  Musculoskeletal:     Cervical back: Normal range of motion.  Skin:    General: Skin is warm and dry.  Neurological:     Mental Status: She is alert and oriented to person, place, and time.  Psychiatric:        Behavior: Behavior normal.     ED Results / Procedures / Treatments   Labs (all labs ordered are listed, but only abnormal results are displayed) Labs Reviewed - No data to display  EKG None  Radiology No results found.  Procedures Procedures (including critical care time)  Medications Ordered in ED Medications - No data to display  ED Course  I have reviewed the triage vital signs and the nursing notes.  Pertinent labs & imaging results that were available during my care of the patient were reviewed by me and considered in my medical decision making (see chart for details).    MDM Rules/Calculators/A&P                        Patient here with cough and congestion- sxs are mild. Differential diagnosis for emergent cause of cough includes but is not limited to upper respiratory infection, lower respiratory infection, allergies, asthma, irritants, foreign body, medications such as ACE inhibitors, reflux, asthma, CHF, lung cancer, interstitial lung disease, psychiatric causes, postnasal drip and postinfectious bronchospasm. Congestion may also be due to normal physiuologic changes of late pregnancy. Strep negative. Patient declines cxr. Normal fetal heart rate. COvid test pending. Patient appears appropriate for discharge with close op f/u.   Christie Yates was evaluated in Emergency Department on 05/21/2019 for the symptoms described in the history of present illness. She was evaluated in the context of the global COVID-19 pandemic, which necessitated consideration that the patient might be at risk for infection with the SARS-CoV-2 virus that causes COVID-19. Institutional  protocols and algorithms that pertain to the evaluation of patients at risk for COVID-19 are in a state of rapid change based on information released by regulatory bodies including the CDC and federal and state organizations. These policies and algorithms were followed during the patient's care in the ED.  Final Clinical Impression(s) / ED Diagnoses Final diagnoses:  None    Rx / DC Orders ED Discharge Orders  None       Arthor Captain, PA-C 05/22/19 1055    Linwood Dibbles, MD 05/22/19 1212

## 2019-05-21 NOTE — Discharge Instructions (Addendum)
Please remain at home until your covid test results ( 24-48 hours) Return to the emergency department if you develop fever, severe shortness of breath.

## 2019-05-21 NOTE — ED Notes (Signed)
Patient verbalizes understanding of discharge instructions . Opportunity for questions and answers were provided . Armband removed by staff ,Pt discharged from ED. W/C  offered at D/C  and Declined W/C at D/C and was escorted to lobby by RN.  

## 2019-05-21 NOTE — ED Triage Notes (Signed)
Pt reports congestion, cough  and sore throat for 1 week. Pt is [redacted] week pregnant- pt does fel sob and has cp with deep inspiration this is not constant. Pt currently has no chest pain. Pt denies any abd pain or vaginal bleeding.

## 2019-06-01 ENCOUNTER — Ambulatory Visit (HOSPITAL_COMMUNITY)
Admission: EM | Admit: 2019-06-01 | Discharge: 2019-06-01 | Disposition: A | Discharge status: Home / Self Care | Payer: Medicaid Other | Attending: Family Medicine | Admitting: Family Medicine

## 2019-06-01 ENCOUNTER — Other Ambulatory Visit: Payer: Self-pay

## 2019-06-01 ENCOUNTER — Encounter (HOSPITAL_COMMUNITY): Payer: Self-pay

## 2019-06-01 DIAGNOSIS — Z20822 Contact with and (suspected) exposure to covid-19: Secondary | ICD-10-CM | POA: Diagnosis present

## 2019-06-01 NOTE — ED Triage Notes (Addendum)
Pt presents to UC for COVID testing after exposure 2 days ago. Pt reports having cough and sore throat x 3 weeks. Pt is [redacted] week pregnant. Pt needs COVID test to go back to work.

## 2019-06-01 NOTE — Discharge Instructions (Signed)
Quarantine until the test result is available You can check for result in My chart

## 2019-06-01 NOTE — ED Provider Notes (Signed)
[facility]    CSN: [id number] Arrival date & time: 06/01/19  1157      History   Chief Complaint Chief Complaint  Patient presents with  . COVID exposure  . Sore Throat  . Cough    HPI Christie Yates is a 24 y.o. female.   HPI  Patient states that one of her coworkers tested positive for Covid.  She is sent in by her employer for testing.  She has no symptoms.  She is required to have a test before she can return to work. She had a cough and sore throat 3 weeks ago.  Her Covid test at that time was negative.  She has improved  Past Medical History:  Diagnosis Date  . Asthma   . Carpal tunnel syndrome 2016  . PCOS (polycystic ovarian syndrome) 2017    There are no problems to display for this patient.   Past Surgical History:  Procedure Laterality Date  . WISDOM TOOTH EXTRACTION      OB History    Gravida  1   Para      Term      Preterm      AB      Living        SAB      TAB      Ectopic      Multiple      Live Births               Home Medications    Prior to Admission medications   Medication Sig Start Date End Date Taking? Authorizing Provider  acetaminophen (TYLENOL) 325 MG tablet Take 325 mg by mouth every 6 (six) hours as needed for mild pain.    [provider]  Doxylamine Succinate, Sleep, (UNISOM PO) Take 1 tablet by mouth at bedtime as needed (sleep).     [provider]  guaifenesin (ROBITUSSIN) 100 MG/5ML syrup Take 200 mg by mouth 3 (three) times daily as needed for cough.    [provider]  Prenatal Vit-Fe Fumarate-FA (MULTIVITAMIN-PRENATAL) 27-0.8 MG TABS tablet Take 1 tablet by mouth daily at 12 noon.    [provider]  vitamin B-6 (PYRIDOXINE) 25 MG tablet Take 25 mg by mouth daily.    [provider]    Family History Family History  Problem Relation Age of Onset  . Multiple sclerosis Mother   . Crohn's disease Mother   . Cancer Father     Social  History Social History   Tobacco Use  . Smoking status: Former Smoker    Quit date: 04/17/2019    Years since quitting: 0.1  . Smokeless tobacco: Never Used  Substance Use Topics  . Alcohol use: Never  . Drug use: Never     Allergies   Penicillins and Sulfa antibiotics   Review of Systems Review of Systems  Patient denies current symptoms Physical Exam Triage Vital Signs ED Triage Vitals  Enc Vitals Group     BP 06/01/19 1303 124/81     Pulse Rate 06/01/19 1303 100     Resp 06/01/19 1303 18     Temp 06/01/19 1303 98.9 F (37.2 C)     Temp Source 06/01/19 1303 Oral     SpO2 --      Weight --      Height --      Head Circumference --      Peak Flow --      Pain Score 06/01/19  1301 4     Pain Loc --      Pain Edu? --      Excl. in [place]? --    No data found.  Updated Vital Signs BP 124/81 (BP Location: Left Arm)   Pulse 100   Temp 98.9 F (37.2 C) (Oral)   Resp 18   LMP 09/22/2018 (Approximate)      Physical Exam Constitutional:      General: She is not in acute distress.    Appearance: She is well-developed. She is obese.     Comments: Gravid abdomen  HENT:     Head: Normocephalic and atraumatic.     Mouth/Throat:     Comments: Mask in place Eyes:     Conjunctiva/sclera: Conjunctivae normal.     Pupils: Pupils are equal, round, and reactive to light.  Cardiovascular:     Rate and Rhythm: Normal rate.  Pulmonary:     Effort: Pulmonary effort is normal. No respiratory distress.  Musculoskeletal:        General: Normal range of motion.     Cervical back: Normal range of motion.  Skin:    General: Skin is warm and dry.  Neurological:     Mental Status: She is alert.  Psychiatric:        Mood and Affect: Mood normal.        Behavior: Behavior normal.      UC Treatments / Results  Labs (all labs ordered are listed, but only abnormal results are displayed) Labs Reviewed  NOVEL CORONAVIRUS, NAA (HOSP ORDER, SEND-OUT TO REF LAB; TAT 18-24 HRS)     EKG   Radiology No results found.  Procedures Procedures (including critical care time)  Medications Ordered in UC Medications - No data to display  Initial Impression / Assessment and Plan / UC Course  I have reviewed the triage vital signs and the nursing notes.  Pertinent labs & imaging results that were available during my care of the patient were reviewed by me and considered in my medical decision making (see chart for details).     Reviewed the importance of quarantine until test results are available. Final Clinical Impressions(s) / UC Diagnoses   Final diagnoses:  Encounter for laboratory testing for COVID-19 virus     Discharge Instructions     Quarantine until the test result is available You can check for result in My chart   ED Prescriptions    None     PDMP not reviewed this encounter.   Eustace Moore, MD 06/01/19 2105

## 2019-06-03 LAB — NOVEL CORONAVIRUS, NAA (HOSP ORDER, SEND-OUT TO REF LAB; TAT 18-24 HRS): SARS-CoV-2, NAA: NOT DETECTED

## 2019-06-12 ENCOUNTER — Encounter (HOSPITAL_COMMUNITY): Payer: Self-pay

## 2019-06-12 ENCOUNTER — Telehealth (HOSPITAL_COMMUNITY): Payer: Self-pay

## 2019-06-12 ENCOUNTER — Ambulatory Visit (HOSPITAL_COMMUNITY)
Admission: EM | Admit: 2019-06-12 | Discharge: 2019-06-12 | Disposition: A | Discharge status: Home / Self Care | Payer: Medicaid Other | Attending: Family Medicine | Admitting: Family Medicine

## 2019-06-12 ENCOUNTER — Other Ambulatory Visit: Payer: Self-pay

## 2019-06-12 ENCOUNTER — Telehealth (HOSPITAL_COMMUNITY): Payer: Self-pay | Admitting: Family Medicine

## 2019-06-12 DIAGNOSIS — J0110 Acute frontal sinusitis, unspecified: Secondary | ICD-10-CM | POA: Diagnosis not present

## 2019-06-12 MED ORDER — AZITHROMYCIN 250 MG PO TABS: ORAL_TABLET | ORAL | 0 refills | Status: DC

## 2019-06-12 MED ORDER — CETIRIZINE HCL 10 MG PO TABS: 10.0000 mg | ORAL_TABLET | Freq: Every day | ORAL | 0 refills | Status: DC

## 2019-06-12 MED ORDER — AMOXICILLIN 500 MG PO CAPS: 1000.0000 mg | ORAL_CAPSULE | Freq: Three times a day (TID) | ORAL | 0 refills | Status: DC

## 2019-06-12 NOTE — ED Triage Notes (Signed)
Pt presents with nasal congestion and sinus pressure for about a month.  Pt is [redacted] weeks pregnant

## 2019-06-12 NOTE — Telephone Encounter (Signed)
Called with penicillin allergy .  Changed to a z pak

## 2019-06-12 NOTE — Discharge Instructions (Signed)
Medication as prescribed Follow up as needed for continued or worsening symptoms  

## 2019-06-13 NOTE — ED Provider Notes (Signed)
[facility]    CSN: [id number] Arrival date & time: 06/12/19  1423      History   Chief Complaint Chief Complaint  Patient presents with  . Sinus Issues    HPI Christie Yates is a 24 y.o. female.   Patient is a 24 year old female presents today with nasal congestion, sinus pressure, thick mucus, headache.  Symptoms have been constant, waxing and waning over the past month.  She has been using Flonase without any relief of her symptoms.  Like her symptoms have worsened.  Tested negative for Covid recently.  No cough, chest congestion, fever, chills, bodies or night sweats. Pt is [redacted] weeks pregnant.   ROS per HPI      Past Medical History:  Diagnosis Date  . Asthma   . Carpal tunnel syndrome 2016  . PCOS (polycystic ovarian syndrome) 2017    There are no problems to display for this patient.   Past Surgical History:  Procedure Laterality Date  . WISDOM TOOTH EXTRACTION      OB History    Gravida  1   Para      Term      Preterm      AB      Living        SAB      TAB      Ectopic      Multiple      Live Births               Home Medications    Prior to Admission medications   Medication Sig Start Date End Date Taking? Authorizing Provider  acetaminophen (TYLENOL) 325 MG tablet Take 325 mg by mouth every 6 (six) hours as needed for mild pain.    [provider]  amoxicillin (AMOXIL) 500 MG capsule Take 2 capsules (1,000 mg total) by mouth 3 (three) times daily for 7 days. 06/12/19 06/19/19  Dahlia Byes A, NP  azithromycin (ZITHROMAX Z-PAK) 250 MG tablet Take two pills today followed by one a day until gone 06/12/19   Eustace Moore, MD  cetirizine (ZYRTEC) 10 MG tablet Take 1 tablet (10 mg total) by mouth daily. 06/12/19   Dahlia Byes A, NP  Doxylamine Succinate, Sleep, (UNISOM PO) Take 1 tablet by mouth at bedtime as needed (sleep).     [provider]  guaifenesin (ROBITUSSIN) 100 MG/5ML syrup Take 200 mg by mouth 3  (three) times daily as needed for cough.    [provider]  Prenatal Vit-Fe Fumarate-FA (MULTIVITAMIN-PRENATAL) 27-0.8 MG TABS tablet Take 1 tablet by mouth daily at 12 noon.    [provider]  vitamin B-6 (PYRIDOXINE) 25 MG tablet Take 25 mg by mouth daily.    [provider]    Family History Family History  Problem Relation Age of Onset  . Multiple sclerosis Mother   . Crohn's disease Mother   . Cancer Father     Social History Social History   Tobacco Use  . Smoking status: Former Smoker    Quit date: 04/17/2019    Years since quitting: 0.1  . Smokeless tobacco: Never Used  Substance Use Topics  . Alcohol use: Never  . Drug use: Never     Allergies   Penicillins and Sulfa antibiotics   Review of Systems Review of Systems   Physical Exam Triage Vital Signs ED Triage Vitals  Enc Vitals Group     BP 06/12/19 1510 130/89     Pulse  Rate 06/12/19 1510 (!) 102     Resp 06/12/19 1510 20     Temp 06/12/19 1510 98.1 F (36.7 C)     Temp Source 06/12/19 1510 Oral     SpO2 06/12/19 1510 98 %     Weight --      Height --      Head Circumference --      Peak Flow --      Pain Score 06/12/19 1511 3     Pain Loc --      Pain Edu? --      Excl. in [place]? --    No data found.  Updated Vital Signs BP 130/89 (BP Location: Left Arm)   Pulse (!) 102   Temp 98.1 F (36.7 C) (Oral)   Resp 20   LMP 09/22/2018 (Approximate)   SpO2 98%   Visual Acuity Right Eye Distance:   Left Eye Distance:   Bilateral Distance:    Right Eye Near:   Left Eye Near:    Bilateral Near:     Physical Exam Vitals and nursing note reviewed.  Constitutional:      General: She is not in acute distress.    Appearance: Normal appearance. She is not ill-appearing, toxic-appearing or diaphoretic.  HENT:     Head: Normocephalic and atraumatic.     Right Ear: Tympanic membrane and ear canal normal.     Left Ear: Tympanic membrane and ear canal normal.     Nose:  Congestion and rhinorrhea present.     Comments: Sinus pressure and bilateral nasal turbinate swelling with thick mucous in nares.     Mouth/Throat:     Pharynx: Oropharynx is clear.  Eyes:     Conjunctiva/sclera: Conjunctivae normal.  Cardiovascular:     Rate and Rhythm: Normal rate and regular rhythm.  Pulmonary:     Effort: Pulmonary effort is normal.     Breath sounds: Normal breath sounds.  Musculoskeletal:        General: Normal range of motion.     Cervical back: Normal range of motion.  Skin:    General: Skin is warm and dry.  Neurological:     Mental Status: She is alert.  Psychiatric:        Mood and Affect: Mood normal.      UC Treatments / Results  Labs (all labs ordered are listed, but only abnormal results are displayed) Labs Reviewed - No data to display  EKG   Radiology No results found.  Procedures Procedures (including critical care time)  Medications Ordered in UC Medications - No data to display  Initial Impression / Assessment and Plan / UC Course  I have reviewed the triage vital signs and the nursing notes.  Pertinent labs & imaging results that were available during my care of the patient were reviewed by me and considered in my medical decision making (see chart for details).     Sinusitis-patient has been symptomatic for approximately 1 month.  We will go ahead and treat with antibiotics at this time. Recommended add Zyrtec and continue Flonase Follow up as needed for continued or worsening symptoms  Final Clinical Impressions(s) / UC Diagnoses   Final diagnoses:  Acute non-recurrent frontal sinusitis     Discharge Instructions     Medication as prescribed Follow up as needed for continued or worsening symptoms     ED Prescriptions    Medication Sig Dispense Auth. Provider   amoxicillin (AMOXIL) 500 MG capsule Take  2 capsules (1,000 mg total) by mouth 3 (three) times daily for 7 days. 40 capsule Jessieca Rhem A, NP    cetirizine (ZYRTEC) 10 MG tablet Take 1 tablet (10 mg total) by mouth daily. 30 tablet Dahlia Byes A, NP     PDMP not reviewed this encounter.   Dahlia Byes A, NP 06/13/19 [phone]

## 2019-06-15 ENCOUNTER — Other Ambulatory Visit: Payer: Self-pay

## 2019-06-15 ENCOUNTER — Encounter (HOSPITAL_COMMUNITY): Payer: Self-pay | Admitting: Obstetrics & Gynecology

## 2019-06-15 ENCOUNTER — Inpatient Hospital Stay (HOSPITAL_COMMUNITY)
Admission: AD | Admit: 2019-06-15 | Discharge: 2019-06-20 | DRG: 788 | Disposition: A | Discharge status: Home / Self Care | Payer: Medicaid Other | Attending: Obstetrics & Gynecology | Admitting: Obstetrics & Gynecology

## 2019-06-15 DIAGNOSIS — E669 Obesity, unspecified: Secondary | ICD-10-CM | POA: Diagnosis present

## 2019-06-15 DIAGNOSIS — O1404 Mild to moderate pre-eclampsia, complicating childbirth: Secondary | ICD-10-CM | POA: Diagnosis present

## 2019-06-15 DIAGNOSIS — Z3A38 38 weeks gestation of pregnancy: Secondary | ICD-10-CM | POA: Diagnosis not present

## 2019-06-15 DIAGNOSIS — Z880 Allergy status to penicillin: Secondary | ICD-10-CM | POA: Diagnosis not present

## 2019-06-15 DIAGNOSIS — Z20822 Contact with and (suspected) exposure to covid-19: Secondary | ICD-10-CM | POA: Diagnosis present

## 2019-06-15 DIAGNOSIS — O9902 Anemia complicating childbirth: Secondary | ICD-10-CM | POA: Diagnosis present

## 2019-06-15 DIAGNOSIS — O99334 Smoking (tobacco) complicating childbirth: Secondary | ICD-10-CM | POA: Diagnosis present

## 2019-06-15 DIAGNOSIS — O99214 Obesity complicating childbirth: Secondary | ICD-10-CM | POA: Diagnosis present

## 2019-06-15 DIAGNOSIS — O99210 Obesity complicating pregnancy, unspecified trimester: Secondary | ICD-10-CM

## 2019-06-15 DIAGNOSIS — O1494 Unspecified pre-eclampsia, complicating childbirth: Secondary | ICD-10-CM | POA: Diagnosis present

## 2019-06-15 DIAGNOSIS — Z98891 History of uterine scar from previous surgery: Secondary | ICD-10-CM

## 2019-06-15 DIAGNOSIS — Z3A39 39 weeks gestation of pregnancy: Secondary | ICD-10-CM | POA: Diagnosis not present

## 2019-06-15 DIAGNOSIS — F17210 Nicotine dependence, cigarettes, uncomplicated: Secondary | ICD-10-CM | POA: Diagnosis present

## 2019-06-15 DIAGNOSIS — D649 Anemia, unspecified: Secondary | ICD-10-CM | POA: Diagnosis present

## 2019-06-15 LAB — CBC
HCT: 40.7 % (ref 36.0–46.0)
Hemoglobin: 13.6 g/dL (ref 12.0–15.0)
MCH: 30.6 pg (ref 26.0–34.0)
MCHC: 33.4 g/dL (ref 30.0–36.0)
MCV: 91.5 fL (ref 80.0–100.0)
Platelets: 246 10*3/uL (ref 150–400)
RBC: 4.45 MIL/uL (ref 3.87–5.11)
RDW: 14.6 % (ref 11.5–15.5)
WBC: 9.9 10*3/uL (ref 4.0–10.5)
nRBC: 0 % (ref 0.0–0.2)

## 2019-06-15 LAB — COMPREHENSIVE METABOLIC PANEL
ALT: 14 U/L (ref 0–44)
AST: 23 U/L (ref 15–41)
Albumin: 3.1 g/dL — ABNORMAL LOW (ref 3.5–5.0)
Alkaline Phosphatase: 186 U/L — ABNORMAL HIGH (ref 38–126)
Anion gap: 13 (ref 5–15)
BUN: 12 mg/dL (ref 6–20)
CO2: 17 mmol/L — ABNORMAL LOW (ref 22–32)
Calcium: 9.7 mg/dL (ref 8.9–10.3)
Chloride: 106 mmol/L (ref 98–111)
Creatinine, Ser: 0.52 mg/dL (ref 0.44–1.00)
GFR calc Af Amer: >60 mL/min (ref >60)
GFR calc non Af Amer: >60 mL/min (ref >60)
Glucose, Bld: 73 mg/dL (ref 70–99)
Potassium: 4.4 mmol/L (ref 3.5–5.1)
Sodium: 136 mmol/L (ref 135–145)
Total Bilirubin: 0.3 mg/dL (ref 0.3–1.2)
Total Protein: 5.8 g/dL — ABNORMAL LOW (ref 6.5–8.1)

## 2019-06-15 LAB — PROTEIN / CREATININE RATIO, URINE
Creatinine, Urine: 82.91 mg/dL
Protein Creatinine Ratio: 0.96 mg/mg{Cre} — ABNORMAL HIGH (ref 0.00–0.15)
Total Protein, Urine: 80 mg/dL

## 2019-06-15 LAB — TYPE AND SCREEN
ABO/RH(D): O POS
Antibody Screen: NEGATIVE

## 2019-06-15 LAB — SARS CORONAVIRUS 2 (TAT 6-24 HRS): SARS Coronavirus 2: NEGATIVE

## 2019-06-15 MED ORDER — OXYTOCIN 40 UNITS IN NORMAL SALINE INFUSION - SIMPLE MED: 2.5000 [IU]/h | INTRAVENOUS | Status: DC

## 2019-06-15 MED ORDER — LORATADINE 10 MG PO TABS
10.0000 mg | ORAL_TABLET | Freq: Every day | ORAL | Status: DC
  Administered 2019-06-15 – 2019-06-17 (×3): 10 mg via ORAL
  Filled 2019-06-15 (×4): qty 1

## 2019-06-15 MED ORDER — TERBUTALINE SULFATE 1 MG/ML IJ SOLN: 0.2500 mg | Freq: Once | INTRAMUSCULAR | Status: DC | PRN

## 2019-06-15 MED ORDER — LIDOCAINE HCL (PF) 1 % IJ SOLN: 30.0000 mL | INTRAMUSCULAR | Status: DC | PRN

## 2019-06-15 MED ORDER — SOD CITRATE-CITRIC ACID 500-334 MG/5ML PO SOLN
30.0000 mL | ORAL | Status: DC | PRN
  Administered 2019-06-18: 30 mL via ORAL
  Filled 2019-06-15: qty 30

## 2019-06-15 MED ORDER — MISOPROSTOL 25 MCG QUARTER TABLET
ORAL_TABLET | ORAL | Status: AC
  Administered 2019-06-15: 25 ug via BUCCAL
  Filled 2019-06-15: qty 2

## 2019-06-15 MED ORDER — FLUTICASONE PROPIONATE 50 MCG/ACT NA SUSP
2.0000 | Freq: Every day | NASAL | Status: DC
  Administered 2019-06-15 – 2019-06-17 (×3): 2 via NASAL
  Filled 2019-06-15: qty 16

## 2019-06-15 MED ORDER — AZITHROMYCIN 500 MG PO TABS
500.0000 mg | ORAL_TABLET | Freq: Every day | ORAL | Status: AC
  Administered 2019-06-15 – 2019-06-16 (×2): 500 mg via ORAL
  Filled 2019-06-15 (×2): qty 1

## 2019-06-15 MED ORDER — OXYTOCIN BOLUS FROM INFUSION: 500.0000 mL | Freq: Once | INTRAVENOUS | Status: DC

## 2019-06-15 MED ORDER — LACTATED RINGERS IV SOLN: 500.0000 mL | INTRAVENOUS | Status: DC | PRN

## 2019-06-15 MED ORDER — OXYCODONE-ACETAMINOPHEN 5-325 MG PO TABS: 2.0000 | ORAL_TABLET | ORAL | Status: DC | PRN

## 2019-06-15 MED ORDER — OXYCODONE-ACETAMINOPHEN 5-325 MG PO TABS: 1.0000 | ORAL_TABLET | ORAL | Status: DC | PRN

## 2019-06-15 MED ORDER — ONDANSETRON HCL 4 MG/2ML IJ SOLN
4.0000 mg | Freq: Four times a day (QID) | INTRAMUSCULAR | Status: DC | PRN
  Administered 2019-06-16 – 2019-06-18 (×3): 4 mg via INTRAVENOUS
  Filled 2019-06-15 (×3): qty 2

## 2019-06-15 MED ORDER — MISOPROSTOL 50MCG HALF TABLET
50.0000 ug | ORAL_TABLET | ORAL | Status: DC | PRN
  Administered 2019-06-15 (×2): 50 ug via ORAL
  Filled 2019-06-15 (×2): qty 1

## 2019-06-15 MED ORDER — LACTATED RINGERS IV SOLN
INTRAVENOUS | Status: DC
  Administered 2019-06-17: 125 mL/h via INTRAVENOUS

## 2019-06-15 MED ORDER — ACETAMINOPHEN 325 MG PO TABS
650.0000 mg | ORAL_TABLET | ORAL | Status: DC | PRN
  Administered 2019-06-15: 650 mg via ORAL
  Filled 2019-06-15: qty 2

## 2019-06-15 NOTE — Progress Notes (Signed)
Labor Progress Note Christie Yates is a 24 y.o. G1P0 at [redacted]w[redacted]d presented for IOL for PEC  S:  Comfortable. HA improved after Tylenol. Denies other PEX sx.  O:  BP 117/78   Pulse (!) 102   Temp 98.6 F (37 C) (Oral)   Resp 18   Ht 5\' 3"  (1.6 m)   Wt 109.8 kg   LMP 09/22/2018 (Approximate)   BMI 42.89 kg/m  EFM: baseline 130 bpm/ mod variability/ + accels/ no decels  Toco/IUPC: irritability SVE: Dilation: Closed Effacement (%): Thick Cervical Position: Posterior Station: -3 Presentation: Vertex Exam by:: [email or web address] Vtx confirmed by BSUS  A/P: 24 y.o. G1P0 [redacted]w[redacted]d  1. Labor: latent 2. FWB: Cat I 3. Pain: analgesia/anesthesia prn 4. PEC- stable, no severe features  Cytotec for ripening, place FB when able. Anticipate labor progression and SVD.  [redacted]w[redacted]d, CNM 3:34 PM

## 2019-06-15 NOTE — H&P (Addendum)
LABOR AND DELIVERY ADMISSION HISTORY AND PHYSICAL NOTE  Christie Yates is a 24 y.o. female G1P0 with IUP at [redacted]w[redacted]d by early ultrasound presenting as a direct admit for IOL 2/2 to preeclampsia w/o severe features. She had a an elevated urine protein creatinine ratio above 7 per Dr. Hulan Fray.  She was also complaining of headache on 06/14/2019.  Upon arriving today, she is still complaining of minor headache.  She denies any other vision changes, swelling, right upper quadrant pain, chest pain, shortness of breath.  She reports positive fetal movement. She denies leakage of fluid, vaginal bleeding, or contractions.   She plans on bottle feeding. Her contraception plan is: OCPs.  Prenatal History/Complications: [facility] [place]:  @[redacted]w[redacted]d , CWD, normal anatomy, breech presentation, posterior placenta, 17th%ile, EFW 016W  Pregnancy complications:  - None  Past Medical History: Past Medical History:  Diagnosis Date   Asthma    Carpal tunnel syndrome 2016   PCOS (polycystic ovarian syndrome) 2017    Past Surgical History: Past Surgical History:  Procedure Laterality Date   WISDOM TOOTH EXTRACTION      Obstetrical History: OB History     Gravida  1   Para      Term      Preterm      AB      Living         SAB      TAB      Ectopic      Multiple      Live Births              Social History: Social History   Socioeconomic History   Marital status: Significant Other    Spouse name: Not on file   Number of children: Not on file   Years of education: Not on file   Highest education level: Not on file  Occupational History   Not on file  Tobacco Use   Smoking status: Former Smoker    Quit date: 04/17/2019    Years since quitting: 0.1   Smokeless tobacco: Never Used  Substance and Sexual Activity   Alcohol use: Never   Drug use: Never   Sexual activity: Yes  Other Topics Concern   Not on file  Social History Narrative   Not on file    Social Determinants of Health   Financial Resource Strain:    Difficulty of Paying Living Expenses: Not on file  Food Insecurity:    Worried About Running Out of Food in the Last Year: Not on file   [employer] of Food in the Last Year: Not on file  Transportation Needs:    Lack of Transportation (Medical): Not on file   Lack of Transportation (Non-Medical): Not on file  Physical Activity:    Days of Exercise per Week: Not on file   Minutes of Exercise per Session: Not on file  Stress:    Feeling of Stress : Not on file  Social Connections:    Frequency of Communication with Friends and Family: Not on file   Frequency of Social Gatherings with Friends and Family: Not on file   Attends Religious Services: Not on file   Active Member of Clubs or Organizations: Not on file   Attends Archivist Meetings: Not on file   Marital Status: Not on file    Family History: Family History  Problem Relation Age of Onset   Multiple sclerosis Mother    Crohn's disease Mother  Cancer Father     Allergies: Allergies  Allergen Reactions   Penicillins Other (See Comments)    Vomiting and pooping blood Did it involve swelling of the face/tongue/throat, SOB, or low BP? No Did it involve sudden or severe rash/hives, skin peeling, or any reaction on the inside of your mouth or nose? No Did you need to seek medical attention at a hospital or doctor's office? No When did it last happen?  3 years ago If all above answers are "NO", may proceed with cephalosporin use.    Sulfa Antibiotics Swelling    Went to hospital for unbearable stomach pain    Medications Prior to Admission  Medication Sig Dispense Refill Last Dose   acetaminophen (TYLENOL) 325 MG tablet Take 325 mg by mouth every 6 (six) hours as needed for mild pain.   Past Week at Unknown time   azithromycin (ZITHROMAX Z-PAK) 250 MG tablet Take two pills today followed by one a day until gone 6 tablet 0 06/14/2019 at 2100    cetirizine (ZYRTEC) 10 MG tablet Take 1 tablet (10 mg total) by mouth daily. 30 tablet 0 06/14/2019 at 2100   amoxicillin (AMOXIL) 500 MG capsule Take 2 capsules (1,000 mg total) by mouth 3 (three) times daily for 7 days. 40 capsule 0 Unknown at Unknown time   Doxylamine Succinate, Sleep, (UNISOM PO) Take 1 tablet by mouth at bedtime as needed (sleep).       guaifenesin (ROBITUSSIN) 100 MG/5ML syrup Take 200 mg by mouth 3 (three) times daily as needed for cough.      Prenatal Vit-Fe Fumarate-FA (MULTIVITAMIN-PRENATAL) 27-0.8 MG TABS tablet Take 1 tablet by mouth daily at 12 noon.      vitamin B-6 (PYRIDOXINE) 25 MG tablet Take 25 mg by mouth daily.        Review of Systems  All systems reviewed and negative except as stated in HPI  Physical Exam Blood pressure (!) 124/101, pulse (!) 113, temperature 98.6 F (37 C), temperature source Oral, resp. rate 18, height 5\' 3"  (1.6 m), weight 109.8 kg, last menstrual period 09/22/2018. General appearance: alert, oriented, NAD Lungs: normal respiratory effort Heart: regular rate Abdomen: soft, non-tender; gravid, leopolds cephalic Extremities: No calf swelling or tenderness Presentation: cephalic by leopolds  Fetal monitoring: Baseline: 160 bpm, Variability: Good {> 6 bpm), Accelerations: Reactive and Decelerations: Absent Uterine activity: None     Prenatal labs: ABO, Rh: O+  Antibody: Negative Rubella:  Nonimmune RPR:   Negative HBsAg:   Negative HIV:   Negative GC/Chlamydia: Negative GBS:   Negative 2-hr GTT: Negative Genetic screening: Negative Anatomy 09/24/2018: Normal  Prenatal Transfer Tool  Maternal Diabetes: No Genetic Screening: Normal Maternal Ultrasounds/Referrals: Normal Fetal Ultrasounds or other Referrals:  None Maternal Substance Abuse:  No Significant Maternal Medications:  None Significant Maternal Lab Results: Group B Strep negative  Results for orders placed or performed during the hospital encounter of 06/15/19 (from  the past 24 hour(s))  Type and screen [facility]   Collection Time: 06/15/19  1:26 PM  Result Value Ref Range   ABO/RH(D) PENDING    Antibody Screen PENDING    Sample Expiration      06/18/2019,2359 Performed at [facility], [address] [place], [place]     Patient Active Problem List   Diagnosis Date Noted   Pre-eclampsia 06/15/2019    Assessment: Leanda Padmore is a 24 y.o. female G1P0 with IUP at [redacted]w[redacted]d by early ultrasound presenting for  IOL 2/2 to preeclampsia w/o severe features.  #Labor: IOL for preeclampsia without severe features.  Headache has resolved with 6 and 50 mg of Tylenol.  Cytotec 50 mcg p.o. started @ 1544.  Will consider IV placement at a later time. #Preeclampsia without severe features: Continue with induction, monitor for signs and symptoms of severe preeclampsia.  Last blood pressure 131/83.  Preeclampsia labs ordered and pending. #Fetal Wellbeing:  Category I #Pain Control: Epidural and IV pain meds #GBS/ID: Negative #COVID: swab pending #MOF: Bottle #MOC: OCPs #Circ: Yes, undecided outpatient versus inpatient #Anticipated MOD: NSVD   Leonides Cave, DO, PGY-1 Family Medicine Resident, [facility] Faculty Teaching Service  06/15/2019, 1:59 PM  Midwife attestation: I have seen and examined this patient; I agree with above documentation in the resident's note.   PE: Gen: calm comfortable, NAD Resp: normal effort and rate Abd: gravid  ROS, labs, PMH reviewed  Assessment/Plan: Eloina Ergle is a 24 y.o. G1P0 here for IOL for PEC, no severe features Admit to LD Labor: latent FWB: Cat I GBS neg Cytotec for ripening Anticipate SVD  Donette Larry, CNM  06/15/2019, 5:20 PM

## 2019-06-15 NOTE — Progress Notes (Signed)
Labor Progress Note Christie Yates is a 24 y.o. G1P0 at [redacted]w[redacted]d presented for IOL for PEC  S:  Comfortable, denies pain.   O:  BP 129/79   Pulse 90   Temp 98.2 F (36.8 C) (Oral)   Resp 18   Ht 5\' 3"  (1.6 m)   Wt 109.8 kg   LMP 09/22/2018 (Approximate)   BMI 42.89 kg/m  EFM: baseline 125 bpm/ mod variability/ + accels/ no decels  Toco/IUPC: irregular SVE: Dilation: Closed Effacement (%): Thick Cervical Position: Posterior Station: -3 Presentation: Vertex Exam by:: Aaniyah Strohm, CNM  A/P: 24 y.o. G1P0 [redacted]w[redacted]d  1. Labor: latent 2. FWB: Cat I 3. Pain: analgesia prn 4. PEC: stable  Continue Cytotec for cervical ripening. Place FB when able. Anticipate SVD.  [redacted]w[redacted]d, CNM 7:44 PM

## 2019-06-16 ENCOUNTER — Inpatient Hospital Stay (HOSPITAL_COMMUNITY): Payer: Medicaid Other | Admitting: Anesthesiology

## 2019-06-16 ENCOUNTER — Encounter (HOSPITAL_COMMUNITY): Payer: Self-pay | Admitting: Obstetrics & Gynecology

## 2019-06-16 LAB — CBC WITH DIFFERENTIAL/PLATELET
Abs Immature Granulocytes: 0.05 10*3/uL (ref 0.00–0.07)
Basophils Absolute: 0 10*3/uL (ref 0.0–0.1)
Basophils Relative: 0 %
Eosinophils Absolute: 0.1 10*3/uL (ref 0.0–0.5)
Eosinophils Relative: 1 %
HCT: 37.1 % (ref 36.0–46.0)
Hemoglobin: 12.7 g/dL (ref 12.0–15.0)
Immature Granulocytes: 1 %
Lymphocytes Relative: 17 %
Lymphs Abs: 1.5 10*3/uL (ref 0.7–4.0)
MCH: 30.5 pg (ref 26.0–34.0)
MCHC: 34.2 g/dL (ref 30.0–36.0)
MCV: 89.2 fL (ref 80.0–100.0)
Monocytes Absolute: 0.8 10*3/uL (ref 0.1–1.0)
Monocytes Relative: 9 %
Neutro Abs: 6.2 10*3/uL (ref 1.7–7.7)
Neutrophils Relative %: 72 %
Platelets: 189 10*3/uL (ref 150–400)
RBC: 4.16 MIL/uL (ref 3.87–5.11)
RDW: 14.4 % (ref 11.5–15.5)
WBC: 8.6 10*3/uL (ref 4.0–10.5)
nRBC: 0 % (ref 0.0–0.2)

## 2019-06-16 LAB — CBC
HCT: 36.1 % (ref 36.0–46.0)
Hemoglobin: 12.2 g/dL (ref 12.0–15.0)
MCH: 30.2 pg (ref 26.0–34.0)
MCHC: 33.8 g/dL (ref 30.0–36.0)
MCV: 89.4 fL (ref 80.0–100.0)
Platelets: 183 10*3/uL (ref 150–400)
RBC: 4.04 MIL/uL (ref 3.87–5.11)
RDW: 14.4 % (ref 11.5–15.5)
WBC: 8.9 10*3/uL (ref 4.0–10.5)
nRBC: 0 % (ref 0.0–0.2)

## 2019-06-16 LAB — RPR: RPR Ser Ql: NONREACTIVE

## 2019-06-16 MED ORDER — SODIUM CHLORIDE (PF) 0.9 % IJ SOLN
INTRAMUSCULAR | Status: DC | PRN
  Administered 2019-06-16: 12 mL/h via EPIDURAL

## 2019-06-16 MED ORDER — EPHEDRINE 5 MG/ML INJ: 10.0000 mg | INTRAVENOUS | Status: DC | PRN

## 2019-06-16 MED ORDER — NICOTINE 7 MG/24HR TD PT24
7.0000 mg | MEDICATED_PATCH | Freq: Every day | TRANSDERMAL | Status: DC
  Administered 2019-06-16: 7 mg via TRANSDERMAL
  Filled 2019-06-16: qty 1

## 2019-06-16 MED ORDER — LIDOCAINE HCL (PF) 1 % IJ SOLN
INTRAMUSCULAR | Status: DC | PRN
  Administered 2019-06-16 (×2): 4 mL via EPIDURAL

## 2019-06-16 MED ORDER — PHENYLEPHRINE 40 MCG/ML (10ML) SYRINGE FOR IV PUSH (FOR BLOOD PRESSURE SUPPORT): 80.0000 ug | PREFILLED_SYRINGE | INTRAVENOUS | Status: DC | PRN

## 2019-06-16 MED ORDER — LACTATED RINGERS IV SOLN
500.0000 mL | Freq: Once | INTRAVENOUS | Status: AC
  Administered 2019-06-16: 500 mL via INTRAVENOUS

## 2019-06-16 MED ORDER — MISOPROSTOL 50MCG HALF TABLET
50.0000 ug | ORAL_TABLET | Freq: Once | ORAL | Status: AC
  Administered 2019-06-16: 50 ug via BUCCAL
  Filled 2019-06-16: qty 1

## 2019-06-16 MED ORDER — PHENYLEPHRINE 40 MCG/ML (10ML) SYRINGE FOR IV PUSH (FOR BLOOD PRESSURE SUPPORT)
80.0000 ug | PREFILLED_SYRINGE | INTRAVENOUS | Status: DC | PRN
  Filled 2019-06-16: qty 10

## 2019-06-16 MED ORDER — LACTATED RINGERS IV SOLN: 500.0000 mL | Freq: Once | INTRAVENOUS | Status: DC

## 2019-06-16 MED ORDER — FENTANYL CITRATE (PF) 100 MCG/2ML IJ SOLN
100.0000 ug | Freq: Once | INTRAMUSCULAR | Status: AC
  Administered 2019-06-16: 100 ug via INTRAVENOUS
  Filled 2019-06-16: qty 2

## 2019-06-16 MED ORDER — FENTANYL-BUPIVACAINE-NACL 0.5-0.125-0.9 MG/250ML-% EP SOLN
12.0000 mL/h | EPIDURAL | Status: DC | PRN
  Administered 2019-06-17 – 2019-06-18 (×2): 12 mL/h via EPIDURAL
  Filled 2019-06-16 (×3): qty 250

## 2019-06-16 MED ORDER — FENTANYL CITRATE (PF) 100 MCG/2ML IJ SOLN
100.0000 ug | INTRAMUSCULAR | Status: DC | PRN
  Administered 2019-06-16: 100 ug via INTRAVENOUS

## 2019-06-16 MED ORDER — DIPHENHYDRAMINE HCL 50 MG/ML IJ SOLN: 12.5000 mg | INTRAMUSCULAR | Status: DC | PRN

## 2019-06-16 MED ORDER — MISOPROSTOL 25 MCG QUARTER TABLET
25.0000 ug | ORAL_TABLET | ORAL | Status: DC | PRN
  Administered 2019-06-16: 25 ug via VAGINAL
  Filled 2019-06-16: qty 1

## 2019-06-16 MED ORDER — FENTANYL CITRATE (PF) 100 MCG/2ML IJ SOLN
INTRAMUSCULAR | Status: AC
  Filled 2019-06-16: qty 2

## 2019-06-16 NOTE — Progress Notes (Signed)
Labor Progress Note Christie Yates is a 24 y.o. G1P0 at [redacted]w[redacted]d presented for IOL d/t PEC w/o SF S: patient sleeping comfortably  O:  BP 119/81   Pulse 80   Temp 98.2 F (36.8 C) (Oral)   Resp 18   Ht 5\' 3"  (1.6 m)   Wt 109.8 kg   LMP 09/22/2018 (Approximate)   BMI 42.89 kg/m  EFM: 130bpm/+accels/-decels  CVE: Dilation: 1 Effacement (%): 50 Cervical Position: Posterior Station: -3 Presentation: Vertex Exam by:: Vennie Waymire, MD   A&P: 24 y.o. G1P0 [redacted]w[redacted]d here for IOL d/t PEC w/o SF #Labor: Received 3 cytotec so far, placing 4th dose buccal now. Plan to place FB when able #Pain: per patient request #FWB: Cat I #GBS negative #PEC: one mild range pressure on admission 144/100, normotensive since then, remains asymptomatic. P:C 0.96, all other labs WNL.   [redacted]w[redacted]d, MD 4:47 AM

## 2019-06-16 NOTE — Anesthesia Procedure Notes (Signed)
Epidural Patient location during procedure: OB Start time: 06/16/2019 8:53 PM End time: 06/16/2019 9:01 PM  Staffing Anesthesiologist: Mal Amabile, MD Performed: anesthesiologist   Preanesthetic Checklist Completed: patient identified, IV checked, site marked, risks and benefits discussed, surgical consent, monitors and equipment checked, pre-op evaluation and timeout performed  Epidural Patient position: sitting Prep: DuraPrep and site prepped and draped Patient monitoring: continuous pulse ox and blood pressure Approach: midline Location: L3-L4 Injection technique: LOR air  Needle:  Needle type: Tuohy  Needle gauge: 17 G Needle length: 9 cm and 9 Needle insertion depth: 6 cm Catheter type: closed end flexible Catheter size: 19 Gauge Catheter at skin depth: 11 cm Test dose: negative and Other  Assessment Events: blood not aspirated, injection not painful, no injection resistance, no paresthesia and negative IV test  Additional Notes Patient identified. Risks and benefits discussed including failed block, incomplete  Pain control, post dural puncture headache, nerve damage, paralysis, blood pressure Changes, nausea, vomiting, reactions to medications-both toxic and allergic and post Partum back pain. All questions were answered. Patient expressed understanding and wished to proceed. Sterile technique was used throughout procedure. Epidural site was Dressed with sterile barrier dressing. No paresthesias, signs of intravascular injection Or signs of intrathecal spread were encountered.  Patient was more comfortable after the epidural was dosed. Please see RN's note for documentation of vital signs and FHR which are stable. Reason for block:procedure for pain

## 2019-06-16 NOTE — Progress Notes (Signed)
Labor Progress Note Christie Yates is a 24 y.o. G1P0 at [redacted]w[redacted]d presented for IOL for PEC w/o SF S: patient resting comfortably with epidural  O:  BP 128/78   Pulse 89   Temp 98 F (36.7 C) (Oral)   Resp 19   Ht 5\' 3"  (1.6 m)   Wt 109.8 kg   LMP 09/22/2018 (Approximate)   BMI 42.89 kg/m  EFM: 130 bpm/+accels/-decels  CVE: Dilation: 4 Effacement (%): 50 Cervical Position: Posterior Station: -2 Presentation: Vertex Exam by:: [email or web address], RN   A&P: 24 y.o. G1P0 [redacted]w[redacted]d IOL for PEC w/o SF #Labor: Progressing well. FB out at 2129, epidural placed and patient is much more comfortable. Total of 8 doses of cytotec given, most recent placed vaginally at 2243 as patient is still insufficiently effaced. Hopefully will be able to start pitocin at next check. #Pain: Epidural #FWB: Cat I #GBS negative #PEC w/o SF: Mildly elevated BP to 140-150s/80-90s late this afternoon, most recently normotensive since 9 PM. Asymptomatic. Continue to monitor.   2244, MD 11:07 PM

## 2019-06-16 NOTE — Progress Notes (Signed)
Labor Progress Note Christie Yates is a 24 y.o. G1P0 at [redacted]w[redacted]d presented for IOL for preeclampsia without severe features  S:  Starting to feel some uncomfortable cramping  O:  BP (!) 147/79   Pulse (!) 104   Temp 98 F (36.7 C) (Oral)   Resp 18   Ht 5\' 3"  (1.6 m)   Wt 109.8 kg   LMP 09/22/2018 (Approximate)   BMI 42.89 kg/m   Fetal Tracing:  Baseline: 120 Variability: moderate Accels: 15x15 Decels: none  Toco: 1-5   CVE: Dilation: Closed Effacement (%): Thick Cervical Position: Posterior Station: Ballotable Presentation: Vertex Exam by:: [email or web address], CNM    A&P: 24 y.o. G1P0 [redacted]w[redacted]d IOL preeclampsia without severe features #Labor: Cervix unchanged and very posterior. Patient declines FB with speculum. Will continue cytotec #Pain: per patient request #FWB: Cat 1 #GBS negative  [redacted]w[redacted]d, CNM 9:30 AM

## 2019-06-16 NOTE — Progress Notes (Signed)
Labor Progress Note Christie Yates is a 24 y.o. G1P0 at 64w0dpresented for IOL d/t PEC w/o SF S: patient sleeping comfortably  O:  BP 132/67   Pulse (!) 101   Temp 98.2 F (36.8 C) (Oral)   Resp 16   Ht 5' 3"  (1.6 m)   Wt 109.8 kg   LMP 09/22/2018 (Approximate)   BMI 42.89 kg/m  EFM: 130bpm/+accels/-decels  CVE: Dilation: Closed Effacement (%): Thick Cervical Position: Posterior Station: -3 Presentation: Vertex Exam by:: Christie Cegielski, MD   A&P: 25y.o. G1P0 324w0dere for IOL d/t PEC w/o SF #Labor: Received 2 buccal cytotec doses so far, placing 3rd cytotec vaginally now. Plan to recheck in 4 hours and place FB if able. #Pain: per patient request #FWB: Cat I #GBS negative #PEC: one mild range pressure on admission 144/100, normotensive since then, remains asymptomatic. P:C 0.96, all other labs WNL.  #UDS: +MJ early in pregnancy, + nicotine #tobacco use: will give patient nicotine patch, states that she smokes about 5 cigarettes per day #Rubella non-immune: will offer MMR postpartum  Christie DammeMD 12:07 AM

## 2019-06-16 NOTE — Progress Notes (Signed)
Labor Progress Note Christie Yates is a 24 y.o. G1P0 at [redacted]w[redacted]d presented for IOL for preeclampsia without severe features  S:  Patient comfortable. Feeling occasional cramping.   O:  BP (!) 144/80   Pulse 97   Temp 98.5 F (36.9 C) (Oral)   Resp 18   Ht 5\' 3"  (1.6 m)   Wt 109.8 kg   LMP 09/22/2018 (Approximate)   BMI 42.89 kg/m   Fetal Tracing:  Baseline: 120 Variability: moderate Accels: 15x15 Decels: none  Toco: irritability   CVE: Dilation: 1 Effacement (%): 50 Cervical Position: Posterior Station: -2 Presentation: Vertex Exam by:: [email or web address], CNM    A&P: 25 y.o. G1P0 [redacted]w[redacted]d IOL for preeclampsia without severe features #Labor: Progressing well. Discussed with patient placement of foley balloon for cervical ripening. Risks and benefits reviewed. Patient agreeable to plan of care. Foley balloon inserted without difficulty and inflated with 60cc sterile water. Patient tolerated procedure well.  Will continue cytotec #Pain: IV fentanyl #FWB: Cat 1 #GBS negative  [redacted]w[redacted]d, CNM 6:39 PM

## 2019-06-16 NOTE — Progress Notes (Signed)
Labor Progress Note Despina Boan is a 24 y.o. G1P0 at [redacted]w[redacted]d presented for IOL for preeclampsia without severe features  S:  Patient coping well with contractions  O:  BP 125/78   Pulse (!) 107   Temp 98.4 F (36.9 C) (Oral)   Resp 18   Ht 5\' 3"  (1.6 m)   Wt 109.8 kg   LMP 09/22/2018 (Approximate)   BMI 42.89 kg/m   Fetal Tracing:  Baseline: 125 Variability: moderate Accels: 15x15 Decels: none  Toco: 2-4  CVE: Dilation: Closed Effacement (%): 50 Cervical Position: Posterior Station: -3 Presentation: Vertex Exam by:: [email or web address], CNM    A&P: 24 y.o. G1P0 [redacted]w[redacted]d IOL for preeclampsia without severe features #Labor: Continue cytotec. Patient very intolerant of exams. Unable to place FB at this time. #Pain: per patient request #FWB: Cat 1 #GBS negative  [redacted]w[redacted]d, CNM 1:44 PM

## 2019-06-16 NOTE — Anesthesia Preprocedure Evaluation (Signed)
Anesthesia Evaluation  Patient identified by MRN, date of birth, ID band Patient awake    Reviewed: Allergy & Precautions, H&P , Patient's Chart, lab work & pertinent test results  Airway Mallampati: II  TM Distance: >3 FB Neck ROM: full    Dental no notable dental hx. (+) Teeth Intact   Pulmonary asthma , former smoker,    Pulmonary exam normal breath sounds clear to auscultation       Cardiovascular hypertension, Normal cardiovascular exam Rhythm:regular Rate:Normal     Neuro/Psych  Neuromuscular disease negative psych ROS   GI/Hepatic negative GI ROS, Neg liver ROS,   Endo/Other  Morbid obesityPCOS  Renal/GU negative Renal ROS  negative genitourinary   Musculoskeletal   Abdominal   Peds  Hematology negative hematology ROS (+)   Anesthesia Other Findings   Reproductive/Obstetrics (+) Pregnancy                             Anesthesia Physical Anesthesia Plan  ASA: III  Anesthesia Plan: Epidural   Post-op Pain Management:    Induction:   PONV Risk Score and Plan:   Airway Management Planned:   Additional Equipment:   Intra-op Plan:   Post-operative Plan:   Informed Consent: I have reviewed the patients History and Physical, chart, labs and discussed the procedure including the risks, benefits and alternatives for the proposed anesthesia with the patient or authorized representative who has indicated his/her understanding and acceptance.       Plan Discussed with: Anesthesiologist  Anesthesia Plan Comments:         Anesthesia Quick Evaluation

## 2019-06-17 ENCOUNTER — Encounter (HOSPITAL_COMMUNITY): Payer: Self-pay | Admitting: Obstetrics & Gynecology

## 2019-06-17 MED ORDER — TERBUTALINE SULFATE 1 MG/ML IJ SOLN: 0.2500 mg | Freq: Once | INTRAMUSCULAR | Status: DC | PRN

## 2019-06-17 MED ORDER — OXYTOCIN 40 UNITS IN NORMAL SALINE INFUSION - SIMPLE MED
1.0000 m[IU]/min | INTRAVENOUS | Status: DC
  Administered 2019-06-17 – 2019-06-18 (×2): 2 m[IU]/min via INTRAVENOUS
  Filled 2019-06-17: qty 1000

## 2019-06-17 MED ORDER — PROMETHAZINE HCL 25 MG/ML IJ SOLN
25.0000 mg | Freq: Once | INTRAMUSCULAR | Status: AC
  Administered 2019-06-17: 25 mg via INTRAVENOUS
  Filled 2019-06-17: qty 1

## 2019-06-17 MED ORDER — NICOTINE 7 MG/24HR TD PT24
7.0000 mg | MEDICATED_PATCH | Freq: Every day | TRANSDERMAL | Status: DC
  Administered 2019-06-17 – 2019-06-18 (×2): 7 mg via TRANSDERMAL
  Filled 2019-06-17 (×4): qty 1

## 2019-06-17 NOTE — Progress Notes (Signed)
LABOR PROGRESS NOTE  Christie Yates is a 24 y.o. G1P0 at [redacted]w[redacted]d presented for IOL for PEC w/o SF.  Subjective: Patient is not in pain but is feeling nauseous.  States that she would like to add some Phenergan to her Zofran.  Objective: BP 135/86   Pulse (!) 112   Temp 98.2 F (36.8 C) (Oral)   Resp 16   Ht 5\' 3"  (1.6 m)   Wt 109.8 kg   LMP 09/22/2018 (Approximate)   BMI 42.89 kg/m    Dilation: 6.5 Effacement (%): 80 Cervical Position: Posterior Station: -2 Presentation: Vertex Exam by:: MD Murcka Fetal monitoring: Baseline: 135 bpm, Variability: Good {> 6 bpm), Accelerations: Reactive, and Decelerations: Absent Uterine activity: Frequency: Every 2-73mins minutes, Duration: 30-90 seconds, and Intensity: moderate  Labs: Lab Results  Component Value Date   WBC 8.6 06/16/2019   HGB 12.7 06/16/2019   HCT 37.1 06/16/2019   MCV 89.2 06/16/2019   PLT 189 06/16/2019    Patient Active Problem List   Diagnosis Date Noted  . Pre-eclampsia 06/15/2019    Assessment / Plan: Christie Yates is a 24 y.o. G1P0 at [redacted]w[redacted]d presented for IOL for PEC w/o SF.  #Labor: Cytotec started @ 2359 on 2/5 (X8) . S/p FB out @ 2129. Pitocin @ 16 mu/min. AROM @ 2130 with IUJPC placemement. #Fetal Wellbeing:  Category I #Pain Control: IV pain meds #ID: GBS Negative #Anticipated MOD: NSVD   [id number], DO, PGY-1 Family Medicine Resident, [facility] Faculty Teaching Service  06/17/2019, 9:25 AM

## 2019-06-17 NOTE — Progress Notes (Signed)
Labor Progress Note Timarie Labell is a 24 y.o. G1P0 at [redacted]w[redacted]d presented for IOL for preeclampsia without severe features  S:  Patient reporting intermittent pressure in front of abdomen.   O:  BP 120/81 (BP Location: Left Arm)   Pulse (!) 108   Temp 99.3 F (37.4 C) (Oral)   Resp 16   Ht 5\' 3"  (1.6 m)   Wt 109.8 kg   LMP 09/22/2018 (Approximate)   BMI 42.89 kg/m   Fetal Tracing:  Baseline: 130 Variability: moderate Accels: 15x15 Decels: none  Toco: 2-3 MVUs: 130-200   CVE: Dilation: 5.5 Effacement (%): 70 Cervical Position: Posterior Station: -1 Presentation: Vertex Exam by:: CNM Milam Allbaugh   A&P: 24 y.o. G1P0 [redacted]w[redacted]d IOL for preeclampsia without severe features #Labor: Cervix unchanged since 0245. No change in fetal station. Consulted with Dr. [redacted]w[redacted]d who recommends continuing to titrate pitocin due to Cat 1 tracing and afebrile patient. Patient verbalized understanding of plan of care.  #Pain: epidural #FWB: Cat 1 #GBS negative  Alysia Penna, CNM 4:08 PM

## 2019-06-17 NOTE — Progress Notes (Signed)
LABOR PROGRESS NOTE  Christie Yates is a 24 y.o. G1P0 at [redacted]w[redacted]d  admitted for IOL PreE w/o SF.  Subjective: Comfortable w epidural  Objective: BP 136/82   Pulse 99   Temp 99.3 F (37.4 C) (Oral)   Resp 20   Ht 5\' 3"  (1.6 m)   Wt 109.8 kg   LMP 09/22/2018 (Approximate)   BMI 42.89 kg/m  or  Vitals:   06/17/19 2230 06/17/19 2300 06/17/19 2330 06/17/19 2341  BP: (!) 125/59 117/62 136/82   Pulse: 97 95 99   Resp: 16 20 20    Temp:    99.3 F (37.4 C)  TempSrc:    Oral  Weight:      Height:         Dilation: 5 Effacement (%): 80 Cervical Position: Posterior Station: -1 Presentation: Vertex Exam by:: Mykah Bellomo FHT: baseline rate 135, moderate varibility, +acel, -decel Toco: q3-4 min, inadequate MVU's @ 30u pitocin  Labs: Lab Results  Component Value Date   WBC 8.6 06/16/2019   HGB 12.7 06/16/2019   HCT 37.1 06/16/2019   MCV 89.2 06/16/2019   PLT 189 06/16/2019    Patient Active Problem List   Diagnosis Date Noted  . Pre-eclampsia 06/15/2019    Assessment / Plan: 24 y.o. G1P0 at [redacted]w[redacted]d here for IOL PreE w/o SF.  Labor: s/p miso x8, FB, AROM, and pitocin for 20 hours at this point. On my exam still 5 cm but has good effacement at 80, station -1. Given close to 24h on pitocin will give a pit break and resume pitocin in 4 hours, cont position changes/peanut ball and hopefully can get adequate contractions and labor progress after restart. Discussed with patient and she is amenable, also reviewed that plan could change in setting of maternal/fetal distress ie chorio/CatII tracing etc.  Fetal Wellbeing:  Cat I Pain Control:  epidural GBS: neg Anticipated MOD:  SVD  PreE w SF: normotensive to mild range BP's over past several hours, asymptomatic, ctm. Last CMP 2/5 @ 2000 with normal Cr  [redacted]w[redacted]d, MD/MP[facility] OB Fellow  06/17/2019, 11:58 PM

## 2019-06-17 NOTE — Progress Notes (Signed)
Labor Progress Note Christie Yates is a 24 y.o. G1P0 at [redacted]w[redacted]d presented for IOL for PEC w/o SF S: patient resting comfortably with epidural  O:  BP 120/69   Pulse 92   Temp 98 F (36.7 C) (Oral)   Resp 17   Ht 5\' 3"  (1.6 m)   Wt 109.8 kg   LMP 09/22/2018 (Approximate)   BMI 42.89 kg/m  EFM: 130 bpm/+accels, moderate variability/-decels  CVE: Dilation: 5 Effacement (%): 80 Cervical Position: Posterior Station: -2 Presentation: Vertex Exam by:: Dr. [email or web address]   A&P: 24 y.o. G1P0 [redacted]w[redacted]d IOL for PEC w/o SF #Labor: Progressing well, large bag of water felt preventing good application of fetal head to cervix. Will continue to titrate pitocin. May consider AROM at next check, if appropriate. #Pain: Epidural #FWB: Cat I #GBS negative #PEC w/o SF: Two mild range BP's overnight 140s/70-100s, normotensive since 0530. Patient remains asymptomatic.   [redacted]w[redacted]d, MD 6:51 AM

## 2019-06-17 NOTE — Progress Notes (Signed)
LABOR PROGRESS NOTE  Christie Yates is a 24 y.o. G1P0 at [redacted]w[redacted]d presented for IOL for PEC w/o SF.  Subjective: The patient states that her nausea is doing much better, she is not currently feeling any of her contractions and is not in any pain.  Objective: BP 124/78 (BP Location: Left Arm)   Pulse (!) 101   Temp 98.9 F (37.2 C) (Oral)   Resp 16   Ht 5\' 3"  (1.6 m)   Wt 109.8 kg   LMP 09/22/2018 (Approximate)   BMI 42.89 kg/m    Dilation: 5.5 Effacement (%): 70 Cervical Position: Posterior Station: -1 Presentation: Vertex Exam by:: [email or web address] RN Fetal monitoring: Baseline: 135 bpm, Variability: Good {> 6 bpm), Accelerations: Reactive, and Decelerations: Absent Uterine activity: Frequency: Every 3-4 mins minutes, Duration: 50-100 seconds, and Intensity: moderate  Labs: Lab Results  Component Value Date   WBC 8.6 06/16/2019   HGB 12.7 06/16/2019   HCT 37.1 06/16/2019   MCV 89.2 06/16/2019   PLT 189 06/16/2019    Patient Active Problem List   Diagnosis Date Noted  . Pre-eclampsia 06/15/2019    Assessment / Plan: Christie Yates is a 24 y.o. G1P0 at [redacted]w[redacted]d presented for IOL for PEC w/o SF.  #Labor: Cytotec started @ 2359 on 2/5 (X8) . S/p FB out @ 2129. Pitocin @ 20 mu/min. AROM @ 2130 with IUJPC placemement. #Fetal Wellbeing:  Category I #Pain Control: Epidural #ID: GBS Negative #Anticipated MOD: NSVD   [id number], DO, PGY-1 Family Medicine Resident, [facility] Faculty Teaching Service  06/17/2019, 2:23 PM

## 2019-06-17 NOTE — Progress Notes (Signed)
Labor Progress Note Jeslie Lowe is a 24 y.o. G1P0 at [redacted]w[redacted]d presented for IOL for PEC w/o SF S: patient resting comfortably with epidural  O:  BP 127/63   Pulse 90   Temp 98 F (36.7 C) (Oral)   Resp 16   Ht 5\' 3"  (1.6 m)   Wt 109.8 kg   LMP 09/22/2018 (Approximate)   BMI 42.89 kg/m  EFM: 130 bpm/+accels, moderate variability/-decels  CVE: Dilation: 4.5 Effacement (%): 60 Cervical Position: Posterior Station: -2 Presentation: Vertex Exam by:: Dr. [email or web address]   A&P: 24 y.o. G1P0 [redacted]w[redacted]d IOL for PEC w/o SF #Labor: Some progress in effacement from 50-60% from last check, Bishop score is now 8. Patient received 8 total doses of cytotec and s/p FB. Cervix is now very soft and stretchy, bag of water felt with fetal head not well applied to cervix. Plan to place patient in High Fowler's for optimal positioning of fetal head against cervix and to start pitocin now. #Pain: Epidural #FWB: Cat I #GBS negative #PEC w/o SF: Mildly elevated BP to 140-150s/80-90s late this afternoon, most recently normotensive since 9 PM. Asymptomatic. Continue to monitor.   [redacted]w[redacted]d, MD 2:56 AM

## 2019-06-18 ENCOUNTER — Encounter (HOSPITAL_COMMUNITY)
Admission: AD | Disposition: A | Discharge status: Home / Self Care | Payer: Self-pay | Source: Home / Self Care | Attending: Obstetrics & Gynecology

## 2019-06-18 ENCOUNTER — Encounter (HOSPITAL_COMMUNITY): Payer: Self-pay | Admitting: Obstetrics & Gynecology

## 2019-06-18 DIAGNOSIS — Z98891 History of uterine scar from previous surgery: Secondary | ICD-10-CM

## 2019-06-18 DIAGNOSIS — O1404 Mild to moderate pre-eclampsia, complicating childbirth: Secondary | ICD-10-CM

## 2019-06-18 DIAGNOSIS — Z3A39 39 weeks gestation of pregnancy: Secondary | ICD-10-CM

## 2019-06-18 DIAGNOSIS — O99210 Obesity complicating pregnancy, unspecified trimester: Secondary | ICD-10-CM

## 2019-06-18 LAB — CBC
HCT: 35 % — ABNORMAL LOW (ref 36.0–46.0)
Hemoglobin: 11.6 g/dL — ABNORMAL LOW (ref 12.0–15.0)
MCH: 30.5 pg (ref 26.0–34.0)
MCHC: 33.1 g/dL (ref 30.0–36.0)
MCV: 92.1 fL (ref 80.0–100.0)
Platelets: 182 10*3/uL (ref 150–400)
RBC: 3.8 MIL/uL — ABNORMAL LOW (ref 3.87–5.11)
RDW: 14.3 % (ref 11.5–15.5)
WBC: 12.9 10*3/uL — ABNORMAL HIGH (ref 4.0–10.5)
nRBC: 0 % (ref 0.0–0.2)

## 2019-06-18 SURGERY — Surgical Case
Anesthesia: Epidural | Site: Abdomen | Wound class: Clean Contaminated

## 2019-06-18 MED ORDER — OXYTOCIN 40 UNITS IN NORMAL SALINE INFUSION - SIMPLE MED: 2.5000 [IU]/h | INTRAVENOUS | Status: AC

## 2019-06-18 MED ORDER — STERILE WATER FOR IRRIGATION IR SOLN
Status: DC | PRN
  Administered 2019-06-18: 1

## 2019-06-18 MED ORDER — COCONUT OIL OIL: 1.0000 "application " | TOPICAL_OIL | Status: DC | PRN

## 2019-06-18 MED ORDER — OXYCODONE HCL 5 MG PO TABS
5.0000 mg | ORAL_TABLET | ORAL | Status: DC | PRN
  Administered 2019-06-19 (×2): 10 mg via ORAL
  Administered 2019-06-19: 5 mg via ORAL
  Administered 2019-06-20 (×3): 10 mg via ORAL
  Filled 2019-06-18: qty 2
  Filled 2019-06-18: qty 1
  Filled 2019-06-18 (×4): qty 2

## 2019-06-18 MED ORDER — KETOROLAC TROMETHAMINE 30 MG/ML IJ SOLN
30.0000 mg | Freq: Four times a day (QID) | INTRAMUSCULAR | Status: AC
  Administered 2019-06-19 (×2): 30 mg via INTRAVENOUS
  Filled 2019-06-18 (×3): qty 1

## 2019-06-18 MED ORDER — SCOPOLAMINE 1 MG/3DAYS TD PT72
MEDICATED_PATCH | TRANSDERMAL | Status: AC
  Filled 2019-06-18: qty 1

## 2019-06-18 MED ORDER — TRAMADOL HCL 50 MG PO TABS: 50.0000 mg | ORAL_TABLET | Freq: Four times a day (QID) | ORAL | Status: DC | PRN

## 2019-06-18 MED ORDER — NALBUPHINE HCL 10 MG/ML IJ SOLN: 5.0000 mg | INTRAMUSCULAR | Status: DC | PRN

## 2019-06-18 MED ORDER — ONDANSETRON HCL 4 MG/2ML IJ SOLN
INTRAMUSCULAR | Status: DC | PRN
  Administered 2019-06-18: 4 mg via INTRAVENOUS

## 2019-06-18 MED ORDER — DIBUCAINE (PERIANAL) 1 % EX OINT: 1.0000 "application " | TOPICAL_OINTMENT | CUTANEOUS | Status: DC | PRN

## 2019-06-18 MED ORDER — FENTANYL CITRATE (PF) 100 MCG/2ML IJ SOLN
INTRAMUSCULAR | Status: DC | PRN
  Administered 2019-06-18: 100 ug via EPIDURAL

## 2019-06-18 MED ORDER — TETANUS-DIPHTH-ACELL PERTUSSIS 5-2.5-18.5 LF-MCG/0.5 IM SUSP: 0.5000 mL | Freq: Once | INTRAMUSCULAR | Status: DC

## 2019-06-18 MED ORDER — MEPERIDINE HCL 25 MG/ML IJ SOLN: 6.2500 mg | INTRAMUSCULAR | Status: DC | PRN

## 2019-06-18 MED ORDER — DIPHENHYDRAMINE HCL 25 MG PO CAPS: 25.0000 mg | ORAL_CAPSULE | ORAL | Status: DC | PRN

## 2019-06-18 MED ORDER — ONDANSETRON HCL 4 MG/2ML IJ SOLN: 4.0000 mg | Freq: Three times a day (TID) | INTRAMUSCULAR | Status: DC | PRN

## 2019-06-18 MED ORDER — SIMETHICONE 80 MG PO CHEW: 80.0000 mg | CHEWABLE_TABLET | ORAL | Status: DC | PRN

## 2019-06-18 MED ORDER — METOCLOPRAMIDE HCL 5 MG/ML IJ SOLN
INTRAMUSCULAR | Status: AC
  Filled 2019-06-18: qty 2

## 2019-06-18 MED ORDER — OXYCODONE HCL 5 MG PO TABS: 5.0000 mg | ORAL_TABLET | Freq: Once | ORAL | Status: DC | PRN

## 2019-06-18 MED ORDER — SODIUM CHLORIDE 0.9 % IR SOLN
Status: DC | PRN
  Administered 2019-06-18: 1

## 2019-06-18 MED ORDER — NALBUPHINE HCL 10 MG/ML IJ SOLN: 5.0000 mg | Freq: Once | INTRAMUSCULAR | Status: DC | PRN

## 2019-06-18 MED ORDER — MORPHINE SULFATE (PF) 0.5 MG/ML IJ SOLN
INTRAMUSCULAR | Status: DC | PRN
  Administered 2019-06-18: 3 mg via EPIDURAL
  Administered 2019-06-18: 2 mg via EPIDURAL

## 2019-06-18 MED ORDER — KETOROLAC TROMETHAMINE 30 MG/ML IJ SOLN
INTRAMUSCULAR | Status: AC
  Filled 2019-06-18: qty 1

## 2019-06-18 MED ORDER — ZOLPIDEM TARTRATE 5 MG PO TABS: 5.0000 mg | ORAL_TABLET | Freq: Every evening | ORAL | Status: DC | PRN

## 2019-06-18 MED ORDER — MENTHOL 3 MG MT LOZG: 1.0000 | LOZENGE | OROMUCOSAL | Status: DC | PRN

## 2019-06-18 MED ORDER — LABETALOL HCL 5 MG/ML IV SOLN: 40.0000 mg | INTRAVENOUS | Status: DC | PRN

## 2019-06-18 MED ORDER — LACTATED RINGERS IV SOLN: INTRAVENOUS | Status: DC | PRN

## 2019-06-18 MED ORDER — FENTANYL CITRATE (PF) 100 MCG/2ML IJ SOLN
INTRAMUSCULAR | Status: AC
  Filled 2019-06-18: qty 2

## 2019-06-18 MED ORDER — MORPHINE SULFATE (PF) 0.5 MG/ML IJ SOLN
INTRAMUSCULAR | Status: AC
  Filled 2019-06-18: qty 10

## 2019-06-18 MED ORDER — FERROUS SULFATE 325 (65 FE) MG PO TABS
325.0000 mg | ORAL_TABLET | Freq: Two times a day (BID) | ORAL | Status: DC
  Administered 2019-06-18 – 2019-06-20 (×4): 325 mg via ORAL
  Filled 2019-06-18 (×4): qty 1

## 2019-06-18 MED ORDER — CEFAZOLIN SODIUM-DEXTROSE 2-4 GM/100ML-% IV SOLN
2.0000 g | INTRAVENOUS | Status: AC
  Administered 2019-06-18: 15:00:00 2 g via INTRAVENOUS

## 2019-06-18 MED ORDER — SODIUM BICARBONATE 8.4 % IV SOLN
INTRAVENOUS | Status: DC | PRN
  Administered 2019-06-18: 2 mL via EPIDURAL
  Administered 2019-06-18 (×2): 5 mL via EPIDURAL
  Administered 2019-06-18: 3 mL via EPIDURAL
  Administered 2019-06-18 (×2): 5 mL via EPIDURAL

## 2019-06-18 MED ORDER — LABETALOL HCL 5 MG/ML IV SOLN: 20.0000 mg | INTRAVENOUS | Status: DC | PRN

## 2019-06-18 MED ORDER — MAGNESIUM HYDROXIDE 400 MG/5ML PO SUSP: 30.0000 mL | ORAL | Status: DC | PRN

## 2019-06-18 MED ORDER — KETOROLAC TROMETHAMINE 30 MG/ML IJ SOLN
30.0000 mg | Freq: Four times a day (QID) | INTRAMUSCULAR | Status: AC | PRN
  Administered 2019-06-18 – 2019-06-19 (×2): 30 mg via INTRAVENOUS

## 2019-06-18 MED ORDER — NALOXONE HCL 4 MG/10ML IJ SOLN
1.0000 ug/kg/h | INTRAVENOUS | Status: DC | PRN
  Filled 2019-06-18: qty 5

## 2019-06-18 MED ORDER — MEPERIDINE HCL 25 MG/ML IJ SOLN
INTRAMUSCULAR | Status: AC
  Filled 2019-06-18: qty 1

## 2019-06-18 MED ORDER — SODIUM CHLORIDE 0.9 % IV SOLN
INTRAVENOUS | Status: AC
  Filled 2019-06-18: qty 500

## 2019-06-18 MED ORDER — MEASLES, MUMPS & RUBELLA VAC IJ SOLR
0.5000 mL | Freq: Once | INTRAMUSCULAR | Status: AC
  Administered 2019-06-20: 0.5 mL via SUBCUTANEOUS
  Filled 2019-06-18: qty 0.5

## 2019-06-18 MED ORDER — SODIUM CHLORIDE 0.9% FLUSH: 3.0000 mL | INTRAVENOUS | Status: DC | PRN

## 2019-06-18 MED ORDER — GABAPENTIN 100 MG PO CAPS
300.0000 mg | ORAL_CAPSULE | Freq: Two times a day (BID) | ORAL | Status: DC
  Administered 2019-06-18 – 2019-06-20 (×4): 300 mg via ORAL
  Filled 2019-06-18 (×4): qty 3

## 2019-06-18 MED ORDER — SIMETHICONE 80 MG PO CHEW
80.0000 mg | CHEWABLE_TABLET | ORAL | Status: DC
  Administered 2019-06-19 – 2019-06-20 (×2): 80 mg via ORAL
  Filled 2019-06-18 (×2): qty 1

## 2019-06-18 MED ORDER — KETOROLAC TROMETHAMINE 30 MG/ML IJ SOLN: 30.0000 mg | Freq: Four times a day (QID) | INTRAMUSCULAR | Status: AC | PRN

## 2019-06-18 MED ORDER — METOCLOPRAMIDE HCL 5 MG/ML IJ SOLN
INTRAMUSCULAR | Status: DC | PRN
  Administered 2019-06-18: 10 mg via INTRAVENOUS

## 2019-06-18 MED ORDER — ONDANSETRON HCL 4 MG/2ML IJ SOLN: 4.0000 mg | Freq: Four times a day (QID) | INTRAMUSCULAR | Status: DC | PRN

## 2019-06-18 MED ORDER — DIPHENHYDRAMINE HCL 50 MG/ML IJ SOLN: 12.5000 mg | INTRAMUSCULAR | Status: DC | PRN

## 2019-06-18 MED ORDER — HYDRALAZINE HCL 20 MG/ML IJ SOLN: 5.0000 mg | INTRAMUSCULAR | Status: DC | PRN

## 2019-06-18 MED ORDER — FENTANYL CITRATE (PF) 100 MCG/2ML IJ SOLN: 25.0000 ug | INTRAMUSCULAR | Status: DC | PRN

## 2019-06-18 MED ORDER — FLUTICASONE PROPIONATE 50 MCG/ACT NA SUSP
2.0000 | Freq: Every day | NASAL | Status: DC
  Administered 2019-06-19 – 2019-06-20 (×2): 2 via NASAL
  Filled 2019-06-18: qty 16

## 2019-06-18 MED ORDER — DIPHENHYDRAMINE HCL 25 MG PO CAPS: 25.0000 mg | ORAL_CAPSULE | Freq: Four times a day (QID) | ORAL | Status: DC | PRN

## 2019-06-18 MED ORDER — LACTATED RINGERS IV SOLN: INTRAVENOUS | Status: DC

## 2019-06-18 MED ORDER — PRENATAL MULTIVITAMIN CH
1.0000 | ORAL_TABLET | Freq: Every day | ORAL | Status: DC
  Administered 2019-06-19 – 2019-06-20 (×2): 1 via ORAL
  Filled 2019-06-18 (×2): qty 1

## 2019-06-18 MED ORDER — SODIUM CHLORIDE 0.9 % IV SOLN: INTRAVENOUS | Status: DC | PRN

## 2019-06-18 MED ORDER — HYDRALAZINE HCL 20 MG/ML IJ SOLN: 10.0000 mg | INTRAMUSCULAR | Status: DC | PRN

## 2019-06-18 MED ORDER — HYDROMORPHONE HCL 1 MG/ML IJ SOLN: 1.0000 mg | INTRAMUSCULAR | Status: DC | PRN

## 2019-06-18 MED ORDER — SCOPOLAMINE 1 MG/3DAYS TD PT72
1.0000 | MEDICATED_PATCH | Freq: Once | TRANSDERMAL | Status: DC
  Administered 2019-06-18: 1.5 mg via TRANSDERMAL

## 2019-06-18 MED ORDER — FENTANYL CITRATE (PF) 100 MCG/2ML IJ SOLN: INTRAMUSCULAR | Status: DC | PRN

## 2019-06-18 MED ORDER — NALOXONE HCL 0.4 MG/ML IJ SOLN: 0.4000 mg | INTRAMUSCULAR | Status: DC | PRN

## 2019-06-18 MED ORDER — SENNOSIDES-DOCUSATE SODIUM 8.6-50 MG PO TABS
2.0000 | ORAL_TABLET | ORAL | Status: DC
  Administered 2019-06-19 – 2019-06-20 (×2): 2 via ORAL
  Filled 2019-06-18 (×2): qty 2

## 2019-06-18 MED ORDER — SODIUM CHLORIDE 0.9 % IV SOLN
500.0000 mg | INTRAVENOUS | Status: AC
  Administered 2019-06-18: 15:00:00 500 mg via INTRAVENOUS

## 2019-06-18 MED ORDER — ENOXAPARIN SODIUM 60 MG/0.6ML ~~LOC~~ SOLN
50.0000 mg | SUBCUTANEOUS | Status: DC
  Administered 2019-06-19 – 2019-06-20 (×2): 50 mg via SUBCUTANEOUS
  Filled 2019-06-18 (×2): qty 0.6

## 2019-06-18 MED ORDER — IBUPROFEN 800 MG PO TABS
800.0000 mg | ORAL_TABLET | Freq: Four times a day (QID) | ORAL | Status: DC
  Administered 2019-06-19 – 2019-06-20 (×5): 800 mg via ORAL
  Filled 2019-06-18 (×5): qty 1

## 2019-06-18 MED ORDER — OXYCODONE-ACETAMINOPHEN 5-325 MG PO TABS: 2.0000 | ORAL_TABLET | ORAL | Status: DC | PRN

## 2019-06-18 MED ORDER — OXYCODONE HCL 5 MG/5ML PO SOLN: 5.0000 mg | Freq: Once | ORAL | Status: DC | PRN

## 2019-06-18 MED ORDER — MEPERIDINE HCL 25 MG/ML IJ SOLN
INTRAMUSCULAR | Status: DC | PRN
  Administered 2019-06-18 (×2): 6.25 mg via INTRAVENOUS
  Administered 2019-06-18: 12.5 mg via INTRAVENOUS

## 2019-06-18 MED ORDER — OXYTOCIN 40 UNITS IN NORMAL SALINE INFUSION - SIMPLE MED
INTRAVENOUS | Status: DC | PRN
  Administered 2019-06-18: 40 [IU] via INTRAVENOUS

## 2019-06-18 MED ORDER — WITCH HAZEL-GLYCERIN EX PADS: 1.0000 "application " | MEDICATED_PAD | CUTANEOUS | Status: DC | PRN

## 2019-06-18 SURGICAL SUPPLY — 33 items
BENZOIN TINCTURE PRP APPL 2/3 (GAUZE/BANDAGES/DRESSINGS) ×3 IMPLANT
CHLORAPREP W/TINT 26ML (MISCELLANEOUS) ×3 IMPLANT
CLAMP CORD UMBIL (MISCELLANEOUS) IMPLANT
CLOSURE STERI STRIP 1/2 X4 (GAUZE/BANDAGES/DRESSINGS) ×3 IMPLANT
CLOTH BEACON ORANGE TIMEOUT ST (SAFETY) ×3 IMPLANT
DRSG OPSITE POSTOP 4X10 (GAUZE/BANDAGES/DRESSINGS) ×3 IMPLANT
ELECT REM PT RETURN 9FT ADLT (ELECTROSURGICAL) ×3
ELECTRODE REM PT RTRN 9FT ADLT (ELECTROSURGICAL) ×1 IMPLANT
EXTRACTOR VACUUM M CUP 4 TUBE (SUCTIONS) IMPLANT
EXTRACTOR VACUUM M CUP 4' TUBE (SUCTIONS)
GAUZE SPONGE 4X4 12PLY STRL LF (GAUZE/BANDAGES/DRESSINGS) ×6 IMPLANT
GLOVE BIOGEL PI IND STRL 7.0 (GLOVE) ×3 IMPLANT
GLOVE BIOGEL PI INDICATOR 7.0 (GLOVE) ×6
GLOVE ECLIPSE 7.0 STRL STRAW (GLOVE) ×3 IMPLANT
GOWN STRL REUS W/TWL LRG LVL3 (GOWN DISPOSABLE) ×6 IMPLANT
KIT ABG SYR 3ML LUER SLIP (SYRINGE) IMPLANT
NEEDLE HYPO 22GX1.5 SAFETY (NEEDLE) ×3 IMPLANT
NEEDLE HYPO 25X5/8 SAFETYGLIDE (NEEDLE) ×3 IMPLANT
NS IRRIG 1000ML POUR BTL (IV SOLUTION) ×3 IMPLANT
PACK C SECTION WH (CUSTOM PROCEDURE TRAY) ×3 IMPLANT
PAD ABD 7.5X8 STRL (GAUZE/BANDAGES/DRESSINGS) ×3 IMPLANT
PAD OB MATERNITY 4.3X12.25 (PERSONAL CARE ITEMS) ×3 IMPLANT
PENCIL SMOKE EVAC W/HOLSTER (ELECTROSURGICAL) ×3 IMPLANT
RTRCTR C-SECT PINK 25CM LRG (MISCELLANEOUS) IMPLANT
SUT PDS AB 0 CTX 36 PDP370T (SUTURE) IMPLANT
SUT PLAIN 2 0 XLH (SUTURE) IMPLANT
SUT VIC AB 0 CTX 36 (SUTURE) ×8
SUT VIC AB 0 CTX36XBRD ANBCTRL (SUTURE) ×4 IMPLANT
SUT VIC AB 4-0 KS 27 (SUTURE) ×3 IMPLANT
SYR CONTROL 10ML LL (SYRINGE) ×3 IMPLANT
TOWEL OR 17X24 6PK STRL BLUE (TOWEL DISPOSABLE) ×3 IMPLANT
TRAY FOLEY W/BAG SLVR 14FR LF (SET/KITS/TRAYS/PACK) ×3 IMPLANT
WATER STERILE IRR 1000ML POUR (IV SOLUTION) ×3 IMPLANT

## 2019-06-18 NOTE — Progress Notes (Signed)
LABOR PROGRESS NOTE  Christie Yates is a 24 y.o. G1P0 at [redacted]w[redacted]d  admitted for IOL PreE w/o SF.  Subjective: Comfortable w epidural  Objective: BP (!) 127/53   Pulse 90   Temp 99.4 F (37.4 C) (Oral)   Resp 18   Ht 5\' 3"  (1.6 m)   Wt 109.8 kg   LMP 09/22/2018 (Approximate)   BMI 42.89 kg/m  or  Vitals:   06/18/19 0230 06/18/19 0300 06/18/19 0330 06/18/19 0345  BP: 133/73 124/65 (!) 127/53   Pulse: 95 79 90   Resp: 20 18 18    Temp:    99.4 F (37.4 C)  TempSrc:    Oral  Weight:      Height:         Dilation: 5 Effacement (%): 80 Cervical Position: Posterior Station: -1 Presentation: Vertex Exam by:: Megan Presti  FHT: baseline rate 125, moderate varibility, +acel, -decel Toco: intermittent  Labs: Lab Results  Component Value Date   WBC 8.6 06/16/2019   HGB 12.7 06/16/2019   HCT 37.1 06/16/2019   MCV 89.2 06/16/2019   PLT 189 06/16/2019    Patient Active Problem List   Diagnosis Date Noted  . Pre-eclampsia 06/15/2019    Assessment / Plan: 24 y.o. G1P0 at [redacted]w[redacted]d here for IOL PreE w/o SF.  Labor: s/p miso x8, FB, AROM, and pitocin from 0315-2315. S/p pit break, no change over past four hours, resume pitocin 2x2.   Fetal Wellbeing:  Cat I Pain Control:  epidural GBS: neg Anticipated MOD:  SVD  PreE w SF: normotensive to mild range BP's over past several hours, asymptomatic, ctm. Last CMP 2/5 @ 2000 with normal Cr  02-19-2005, MD/MPH OB Fellow  06/18/2019, 3:47 AM

## 2019-06-18 NOTE — Progress Notes (Addendum)
Went bedside to exam patient and discuss plan. Cervix unchanged for ~ 36 hours. ROM > 24 hours. Pit break given and restarted this morning, Pit at 20 with MVU's around 100. Patient and FOB agreeable to cesarean section for failure to progress. The risks of cesarean section discussed with the patient included but were not limited to: bleeding which may require transfusion or reoperation; infection which may require antibiotics; injury to bowel, bladder, ureters or other surrounding organs; injury to the fetus; need for additional procedures including hysterectomy in the event of a life-threatening hemorrhage; placental abnormalities wth subsequent pregnancies, incisional problems, thromboembolic phenomenon and other postoperative/anesthesia complications. The patient concurred with the proposed plan, giving informed written consent for the procedure.   Patient has been NPO since midnight she will remain NPO for procedure. Anesthesia and OR aware. Preoperative prophylactic antibiotics and SCDs ordered on call to the OR.  To OR when ready.  Barrington Ellison, MD [facility] Family Medicine Fellow, [facility] for [facility], [place] of Attending Supervision of Advanced Practice Provider (PA/CNM/NP): Evaluation and management procedures were performed by the Advanced Practice Provider under my supervision and collaboration.  I have reviewed the Advanced Practice Provider's note and chart, and I agree with the management and plan. Patient met and plan discussed. All questions answered. To OR when ready.  Verita Schneiders, MD, [place] Attending [place], [facility] for [employer], [place]

## 2019-06-18 NOTE — Op Note (Signed)
Christie Yates PROCEDURE DATE: 06/18/2019  PREOPERATIVE DIAGNOSES: Intrauterine pregnancy at [redacted]w[redacted]d weeks gestation; failure to progress: arrest of dilation  POSTOPERATIVE DIAGNOSES: The same  PROCEDURE: Primary Low Transverse Cesarean Section  SURGEON:  Dr. Verita Schneiders  ASSISTANT:  Dr. Barrington Ellison  ANESTHESIOLOGY TEAM: Anesthesiologist: Josephine Igo, MD; Albertha Ghee, MD; Janeece Riggers, MD CRNA: Kathie Rhodes, CRNA  INDICATIONS: Christie Yates is a 24 y.o. G1P1001 at [redacted]w[redacted]d here for cesarean section secondary to the indications listed under preoperative diagnoses; please see preoperative notes for further details.  The risks of cesarean section were discussed with the patient including but were not limited to: bleeding which may require transfusion or reoperation; infection which may require antibiotics; injury to bowel, bladder, ureters or other surrounding organs; injury to the fetus; need for additional procedures including hysterectomy in the event of a life-threatening hemorrhage; placental abnormalities wth subsequent pregnancies, incisional problems, thromboembolic phenomenon and other postoperative/anesthesia complications.   The patient concurred with the proposed plan, giving informed written consent for the procedure.    FINDINGS:  Viable female infant in cephalic presentation.  Apgars 8 and 9.  Clear amniotic fluid.  Intact placenta, three vessel cord.  Normal uterus, fallopian tubes and ovaries bilaterally.  ANESTHESIA: Epidural  ESTIMATED BLOOD LOSS: 350 ml SPECIMENS: Placenta sent to L&D COMPLICATIONS: None immediate  PROCEDURE IN DETAIL:  The patient preoperatively received intravenous antibiotics and had sequential compression devices applied to her lower extremities.  She was then taken to the operating room where the epidural anesthesia was dosed up to surgical level and was found to be adequate. She was then placed in a dorsal supine position with a leftward tilt, and  prepped and draped in a sterile manner.  A foley catheter was placed into her bladder and attached to constant gravity.  After an adequate timeout was performed, a Pfannenstiel skin incision was made with scalpel and carried through to the underlying layer of fascia. The fascia was incised in the midline, and this incision was extended bilaterally using the Mayo scissors.  Kocher clamps were applied to the superior aspect of the fascial incision and the underlying rectus muscles were dissected off bluntly and sharply.  A similar process was carried out on the inferior aspect of the fascial incision. The rectus muscles were separated in the midline and the peritoneum was entered bluntly. The Alexis self-retaining retractor was introduced into the abdominal cavity.  Attention was turned to the lower uterine segment where a low transverse hysterotomy was made with a scalpel and extended bilaterally bluntly.  The infant was successfully delivered, the cord was clamped and cut after one minute, and the infant was handed over to the awaiting neonatology team. Uterine massage was then administered, and the placenta delivered intact with a three-vessel cord. The uterus was then cleared of clots and debris.  The hysterotomy was closed with 0 Vicryl in a running locked fashion, and an imbricating layer was also placed with 0 Vicryl.  The pelvis was cleared of all clot and debris. Hemostasis was confirmed on all surfaces.  The retractor was removed.  The peritoneum was closed with a 0 Vicryl running stitch. The fascia was then closed using 0 Vicryl in a running fashion.  The subcutaneous layer was irrigated, reapproximated with 2-0 plain gut interrupted stitches, and the skin was closed with a 4-0 Vicryl subcuticular stitch. The patient tolerated the procedure well. Sponge, instrument and needle counts were correct x 3.  She was taken to the recovery room in  stable condition.    Jaynie Collins, MD, FACOG Obstetrician &  Gynecologist, [facility] for [employer], [facility]

## 2019-06-18 NOTE — Progress Notes (Signed)
LABOR PROGRESS NOTE  Christie Yates is a 24 y.o. G1P0 at [redacted]w[redacted]d  admitted for IOL PreE w/o SF.  Subjective: Strip review  Objective: BP (!) 141/74   Pulse 98   Temp 98.5 F (36.9 C) (Axillary)   Resp 20   Ht 5\' 3"  (1.6 m)   Wt 109.8 kg   LMP 09/22/2018 (Approximate)   BMI 42.89 kg/m  or  Vitals:   06/18/19 0600 06/18/19 0625 06/18/19 0630 06/18/19 0700  BP: (!) 141/81  140/81 (!) 141/74  Pulse: 100  100 98  Resp: 20  18 20   Temp:  98.5 F (36.9 C)    TempSrc:  Axillary    Weight:      Height:         Dilation: 5 Effacement (%): 80 Cervical Position: Posterior Station: -1 Presentation: Vertex Exam by:: Thelia Tanksley  FHT: baseline rate 135, moderate varibility, +acel, -decel Toco: q7 min, inadequate MVU's  Labs: Lab Results  Component Value Date   WBC 8.6 06/16/2019   HGB 12.7 06/16/2019   HCT 37.1 06/16/2019   MCV 89.2 06/16/2019   PLT 189 06/16/2019    Patient Active Problem List   Diagnosis Date Noted  . Pre-eclampsia 06/15/2019    Assessment / Plan: 24 y.o. G1P0 at [redacted]w[redacted]d here for IOL PreE w/o SF.  Labor: s/p miso x8, FB, AROM, and pitocin from 0315-2315. S/p pit break and resumption of pitocin at 0343. Defer SVE until has adequate contractions.  Fetal Wellbeing:  Cat I Pain Control:  epidural GBS: neg Anticipated MOD:  SVD  PreE w SF: normotensive to mild range BP's over past several hours, ctm. Last CMP 2/5 @ 2000 with normal Cr  02-19-2005, MD/MPH OB Fellow  06/18/2019, 7:31 AM

## 2019-06-18 NOTE — Progress Notes (Addendum)
LABOR PROGRESS NOTE  Christie Yates is a 24 y.o. G1P0 at [redacted]w[redacted]d admitted for IOL PreE w/o SF.  Subjective: Met patient and FOB. Discussed plan and assessed their feelings in the process. Patient feeling some pressure with ctx.  Objective: BP 120/62   Pulse 86   Temp 98.5 F (36.9 C) (Oral)   Resp 17   Ht 5' 3"  (1.6 m)   Wt 109.8 kg   LMP 09/22/2018 (Approximate)   BMI 42.89 kg/m  or  Vitals:   06/18/19 0630 06/18/19 0700 06/18/19 0800 06/18/19 0830  BP: 140/81 (!) 141/74 (!) 152/72 120/62  Pulse: 100 98 (!) 102 86  Resp: 18 20 17    Temp:   98.5 F (36.9 C)   TempSrc:   Oral   Weight:      Height:         Dilation: 5 Effacement (%): 80 Cervical Position: Posterior Station: -1 Presentation: Vertex Exam by:: Eckstat  FHT: baseline rate 135, moderate varibility, +acel, -decel Toco: q3 min, inadequate MVU's  Labs: Lab Results  Component Value Date   WBC 8.6 06/16/2019   HGB 12.7 06/16/2019   HCT 37.1 06/16/2019   MCV 89.2 06/16/2019   PLT 189 06/16/2019    Patient Active Problem List   Diagnosis Date Noted  . Pre-eclampsia 06/15/2019    Assessment / Plan: 24y.o. G1P0 at 366w1dere for IOL PreE w/o SF.  Labor: s/p miso x8, FB, AROM, and pitocin from 0315-2315. S/p pit break and resumption of pitocin at 0343. Patient feeling some pressure and desired cervical exam. Unchanged cervix. Will plan to recheck 4 hours after adequate ctx if no maternal/fetal indication. Guarded for VD. CS as appropriate. D/w Dr. AnHarolyn Rutherford Fetal Wellbeing:  Cat I Pain Control:  epidural GBS: neg Anticipated MOD:  SVD  PreE w SF: normotensive to mild range BP's over past several hours, ctm. Last CMP 2/5 @ 2000 with normal Cr BP Readings from Last 1 Encounters:  06/18/19 120/62   ChBarrington EllisonMD OBEnd[facility] Of [facility] Baltimoreamily Medicine Fellow, [facility] 06/18/2019, 10:08 AM

## 2019-06-18 NOTE — Discharge Summary (Signed)
Postpartum Discharge Summary      Patient Name: Christie Yates DOB: 10/06/1995 MRN: [id number]  Date of admission: 06/15/2019 Delivering Provider: Verita Schneiders Yates   Date of discharge: 06/20/2019  Admitting diagnosis: Pre-eclampsia [O14.90] Intrauterine pregnancy: [redacted]w[redacted]d    Secondary diagnosis:  Principal Problem:   S/P cesarean section for arrest of cervical dilation Active Problems:   Preeclampsia, delivered   Maternal obesity affecting pregnancy, antepartum   [redacted] weeks gestation of pregnancy  Additional problems: None     Discharge diagnosis: Term Pregnancy Delivered and Preeclampsia (mild)                                                                                                Post partum procedures:None  Augmentation: AROM, Pitocin, Cytotec and Foley Balloon  Complications: RZES>92hours  Hospital course:  Induction of Labor With Cesarean Section  24y.o. yo G1P1001 at 340w2das admitted to the hospital 06/15/2019 for induction of labor. Patient admitted for IOL for Pre-E without severe features. Received FB, Cytotec, Pitocin and AROM. Patient failed to progress and was same cervical exam for ~ 36 hours with prolonged ROM for > 24 hours. The patient went for cesarean section due to Arrest of Dilation and Arrest of Descent, and delivered Yates Viable infant,06/18/2019  Membrane Rupture Time/Date: 9:15 AM ,06/17/2019   Details of operation can be found in separate operative Note.  Patient had an uncomplicated postpartum course. BP's monitored and began trending upward on day of discharge, Norvasc 5 mg initiated. She would like to start OCP's after 6 weeks; further discuss due to obesity and HTN. She is ambulating, tolerating Yates regular diet, passing flatus, and urinating well.  Patient is discharged home in stable condition on 06/20/19.                                   Delivery time: 2:42 PM    Magnesium Sulfate received: No BMZ received: No Rhophylac:No MMR:Yes;  ordered Transfusion:No  Physical exam  Vitals:   06/19/19 0950 06/19/19 1550 06/19/19 1932 06/20/19 0623  BP: 122/68 128/72 138/82 140/81  Pulse: 72 84 83 88  Resp: 16 17 20 20   Temp: 98 F (36.7 C) 98 F (36.7 C) 98.3 F (36.8 C) 97.8 F (36.6 C)  TempSrc: Oral Oral Oral Oral  SpO2:   99% 100%  Weight:      Height:       General: alert, cooperative and no distress Lochia: appropriate Uterine Fundus: firm Incision: Healing well with no significant drainage, No significant erythema, Dressing is clean, dry, and intact DVT Evaluation: No evidence of DVT seen on physical exam. Labs: Lab Results  Component Value Date   WBC 12.8 (H) 06/19/2019   HGB 10.5 (L) 06/19/2019   HCT 31.2 (L) 06/19/2019   MCV 90.7 06/19/2019   PLT 206 06/19/2019   CMP Latest Ref Rng & Units 06/19/2019  Glucose 70 - 99 mg/dL 86  BUN 6 - 20 mg/dL 7  Creatinine 0.44 - 1.00 mg/dL 0.61  Sodium 135 - 145 mmol/L 136  Potassium 3.5 - 5.1 mmol/L 3.8  Chloride 98 - 111 mmol/L 104  CO2 22 - 32 mmol/L 21(L)  Calcium 8.9 - 10.3 mg/dL 8.0(L)  Total Protein 6.5 - 8.1 g/dL 4.7(L)  Total Bilirubin 0.3 - 1.2 mg/dL 0.1(L)  Alkaline Phos 38 - 126 U/L 137(H)  AST 15 - 41 U/L 17  ALT 0 - 44 U/L 10    Discharge instruction: per After Visit Summary and "Baby and Me Booklet".  After visit meds:  Allergies as of 06/20/2019      Reactions   Penicillins Diarrhea, Nausea And Vomiting   Did it involve swelling of the face/tongue/throat, SOB, or low BP? No Did it involve sudden or severe rash/hives, skin peeling, or any reaction on the inside of your mouth or nose? No Did you need to seek medical attention at [facility] hospital or doctor's office? No When did it last happen? 2018 If all above answers are "NO", may proceed with cephalosporin use.   Sulfa Antibiotics Swelling   Went to hospital for unbearable stomach pain      Medication List    STOP taking these medications   azithromycin 250 MG tablet Commonly known as:  Zithromax Z-Pak     TAKE these medications   acetaminophen 500 MG tablet Commonly known as: TYLENOL Take 2 tablets (1,000 mg total) by mouth every 8 (eight) hours as needed. What changed:   medication strength  how much to take  when to take this  reasons to take this   amLODipine 5 MG tablet Commonly known as: NORVASC Take 1 tablet (5 mg total) by mouth at bedtime. Start taking on: June 21, 2019   cetirizine 10 MG tablet Commonly known as: ZYRTEC Take 1 tablet (10 mg total) by mouth daily.   dimethicone-zinc oxide cream Apply topically 2 (two) times daily as needed for dry skin.   ferrous sulfate 325 (65 FE) MG tablet Take 1 tablet (325 mg total) by mouth every other day.   fluticasone 50 MCG/ACT nasal spray Commonly known as: FLONASE Place 2 sprays into both nostrils daily.   guaifenesin 100 MG/5ML syrup Commonly known as: ROBITUSSIN Take 200 mg by mouth 3 (three) times daily as needed for cough.   ibuprofen 800 MG tablet Commonly known as: ADVIL Take 1 tablet (800 mg total) by mouth every 6 (six) hours.   oxyCODONE 5 MG immediate release tablet Commonly known as: Oxy IR/ROXICODONE Take 1-2 tablets (5-10 mg total) by mouth every 4 (four) hours as needed for moderate pain.   prenatal multivitamin Tabs tablet Take 1 tablet by mouth daily at 12 noon.   senna-docusate 8.6-50 MG tablet Commonly known as: Senokot-S Take 2 tablets by mouth daily. Start taking on: June 21, 2019   zinc oxide 11.3 % Crea cream Commonly known as: BALMEX Apply 1 application topically 2 (two) times daily.       Diet: routine diet  Activity: Advance as tolerated. Pelvic rest for 6 weeks.   Outpatient follow up:4 weeks Follow up Appt: Future Appointments  Date Time Provider [place]  06/25/2019  8:30 AM [place]  07/02/2019 10:00 AM [place] [facility]  07/27/2019  9:35 AM Anyanwu, Sallyanne Havers, MD [facility]   Follow up  Visit:   Message sent by Dr. Harolyn Rutherford for f/u.    Newborn Data: Live born female  Birth Weight: 3585g   APGAR: 33, 9  Newborn Delivery   Birth date/time:  06/18/2019 14:42:00 Delivery type: C-Section, Low Transverse Trial of labor: Yes C-section categorization: Primary      Baby Feeding: Bottle Disposition:home with mother   06/20/2019 Chauncey Mann, MD

## 2019-06-18 NOTE — Transfer of Care (Signed)
Immediate Anesthesia Transfer of Care Note  Patient: Christie Yates  Procedure(s) Performed: CESAREAN SECTION (N/A Abdomen)  Patient Location: PACU  Anesthesia Type:Epidural  Level of Consciousness: awake, alert  and oriented  Airway & Oxygen Therapy: Patient Spontanous Breathing  Post-op Assessment: Report given to RN and Post -op Vital signs reviewed and stable  Post vital signs: Reviewed and stable  Last Vitals:  Vitals Value Taken Time  BP 150/84 06/18/19 1520  Temp    Pulse 101 06/18/19 1525  Resp 22 06/18/19 1525  SpO2 100 % 06/18/19 1525  Vitals shown include unvalidated device data.  Last Pain:  Vitals:   06/18/19 1520  TempSrc: (P) Oral  PainSc:          Complications: No apparent anesthesia complications

## 2019-06-19 LAB — CBC
HCT: 31.2 % — ABNORMAL LOW (ref 36.0–46.0)
Hemoglobin: 10.5 g/dL — ABNORMAL LOW (ref 12.0–15.0)
MCH: 30.5 pg (ref 26.0–34.0)
MCHC: 33.7 g/dL (ref 30.0–36.0)
MCV: 90.7 fL (ref 80.0–100.0)
Platelets: 206 10*3/uL (ref 150–400)
RBC: 3.44 MIL/uL — ABNORMAL LOW (ref 3.87–5.11)
RDW: 14.3 % (ref 11.5–15.5)
WBC: 12.8 10*3/uL — ABNORMAL HIGH (ref 4.0–10.5)
nRBC: 0 % (ref 0.0–0.2)

## 2019-06-19 LAB — COMPREHENSIVE METABOLIC PANEL
ALT: 10 U/L (ref 0–44)
AST: 17 U/L (ref 15–41)
Albumin: 2 g/dL — ABNORMAL LOW (ref 3.5–5.0)
Alkaline Phosphatase: 137 U/L — ABNORMAL HIGH (ref 38–126)
Anion gap: 11 (ref 5–15)
BUN: 7 mg/dL (ref 6–20)
CO2: 21 mmol/L — ABNORMAL LOW (ref 22–32)
Calcium: 8 mg/dL — ABNORMAL LOW (ref 8.9–10.3)
Chloride: 104 mmol/L (ref 98–111)
Creatinine, Ser: 0.61 mg/dL (ref 0.44–1.00)
GFR calc Af Amer: >60 mL/min (ref >60)
GFR calc non Af Amer: >60 mL/min (ref >60)
Glucose, Bld: 86 mg/dL (ref 70–99)
Potassium: 3.8 mmol/L (ref 3.5–5.1)
Sodium: 136 mmol/L (ref 135–145)
Total Bilirubin: 0.1 mg/dL — ABNORMAL LOW (ref 0.3–1.2)
Total Protein: 4.7 g/dL — ABNORMAL LOW (ref 6.5–8.1)

## 2019-06-19 MED ORDER — ACETAMINOPHEN 500 MG PO TABS
1000.0000 mg | ORAL_TABLET | Freq: Four times a day (QID) | ORAL | Status: DC
  Administered 2019-06-19 – 2019-06-20 (×5): 1000 mg via ORAL
  Filled 2019-06-19 (×5): qty 2

## 2019-06-19 NOTE — Progress Notes (Signed)
Mother called out and Diplomatic Services operational officer could not understand patient. Upon entering room patient was in tears and crying out in pain. Patient had ambulated to restroom and stated she could not get back in bed. Assisted patient to bed and patient request more pain medication. Instructed patient she would get Gabapentin at 0930-10. Patient verbalized understanding.

## 2019-06-19 NOTE — Progress Notes (Addendum)
Subjective: Postpartum Day 1: Cesarean Delivery Patient reports tolerating PO and + flatus, just had foley removed now.  Objective: Vital signs in last 24 hours: Temp:  [97.8 F (36.6 C)-98.9 F (37.2 C)] 98 F (36.7 C) (02/09 0500) Pulse Rate:  [73-103] 73 (02/09 0134) Resp:  [14-20] 14 (02/09 0134) BP: (118-151)/(55-91) 118/71 (02/09 0134) SpO2:  [97 %-100 %] 98 % (02/08 1753)  Physical Exam:  General: alert, cooperative, appears stated age and no distress Lochia: appropriate Uterine Fundus: firm Incision: dressing clean, dry, intact DVT Evaluation: No evidence of DVT seen on physical exam. Negative Homan's sign. No cords or calf tenderness. No significant calf/ankle edema.  Recent Labs    06/18/19 1551 06/19/19 0623  HGB 11.6* 10.5*  HCT 35.0* 31.2*    Assessment/Plan: Status post Cesarean section. Doing well postoperatively. Recommended patient ambulate as much as possible today and report UOP to RN.  Patient induced for PEC w/o SF, BP's postoperatively have been normotensive. Last mildly elevated BP's prior to C/D 139/91 around 4 PM on 2/8. Plan to continue to monitor today, can consider low dose antihypertensive. She remains asymptomatic. Continue current care.  Shirlean Mylar 06/19/2019, 8:05 AM   GME ATTESTATION:  I saw and evaluated the patient. I agree with the findings and the plan of care as documented in the resident's note with exception of the following:  Pain not well controlled- medications adjusted. Advised to take it easy, maybe sit up in the chair this afternoon. Educated on expectations for pain management and weight lifting restrictions. FOB considering vasectomy for birth control.  Expect DC home on POD#3.  Marlowe Alt, DO OB Fellow, Faculty [facility], Center for [facility] 06/19/2019 12:28 PM

## 2019-06-20 MED ORDER — SENNOSIDES-DOCUSATE SODIUM 8.6-50 MG PO TABS: 2.0000 | ORAL_TABLET | ORAL | 0 refills | Status: DC

## 2019-06-20 MED ORDER — ZINC OXIDE 11.3 % EX CREA: 1.0000 "application " | TOPICAL_CREAM | Freq: Two times a day (BID) | CUTANEOUS | 0 refills | Status: DC

## 2019-06-20 MED ORDER — HYDROMORPHONE HCL 2 MG PO TABS
4.0000 mg | ORAL_TABLET | Freq: Once | ORAL | Status: AC | PRN
  Administered 2019-06-20: 4 mg via ORAL
  Filled 2019-06-20: qty 2

## 2019-06-20 MED ORDER — IBUPROFEN 800 MG PO TABS: 800.0000 mg | ORAL_TABLET | Freq: Four times a day (QID) | ORAL | 0 refills | Status: DC

## 2019-06-20 MED ORDER — ACETAMINOPHEN 500 MG PO TABS: 1000.0000 mg | ORAL_TABLET | Freq: Three times a day (TID) | ORAL | 0 refills | Status: DC | PRN

## 2019-06-20 MED ORDER — FERROUS SULFATE 325 (65 FE) MG PO TABS: 325.0000 mg | ORAL_TABLET | ORAL | 0 refills | Status: DC

## 2019-06-20 MED ORDER — OXYCODONE HCL 5 MG PO TABS: 5.0000 mg | ORAL_TABLET | ORAL | 0 refills | Status: DC | PRN

## 2019-06-20 MED ORDER — BAZA PROTECT EX CREA: TOPICAL_CREAM | Freq: Two times a day (BID) | CUTANEOUS | 0 refills | Status: DC | PRN

## 2019-06-20 MED ORDER — AMLODIPINE BESYLATE 5 MG PO TABS
5.0000 mg | ORAL_TABLET | Freq: Every day | ORAL | Status: DC
  Administered 2019-06-20: 5 mg via ORAL
  Filled 2019-06-20: qty 1

## 2019-06-20 MED ORDER — HYDROMORPHONE HCL 1 MG/ML IJ SOLN: 1.0000 mg | Freq: Once | INTRAMUSCULAR | Status: DC

## 2019-06-20 MED ORDER — AMLODIPINE BESYLATE 5 MG PO TABS: 5.0000 mg | ORAL_TABLET | Freq: Every day | ORAL | 0 refills | Status: DC

## 2019-06-20 MED ORDER — ZINC OXIDE 11.3 % EX CREA
TOPICAL_CREAM | Freq: Two times a day (BID) | CUTANEOUS | Status: DC
  Filled 2019-06-20: qty 56

## 2019-06-20 MED FILL — ACETAMINOPHEN 500MG XT STRE: 500 | 5 days supply | Qty: 30 | Fill #0

## 2019-06-20 MED FILL — FERROUS SULFATE 325 MG TAB: 325 (65 FE) | 180 days supply | Qty: 90 | Fill #0

## 2019-06-20 MED FILL — AMLODIPINE BESYLATE 5 MG TA: 5 | 60 days supply | Qty: 60 | Fill #0

## 2019-06-20 MED FILL — oxyCODONE HCL 5 MG TABS: 5 | 4 days supply | Qty: 30 | Fill #0

## 2019-06-20 MED FILL — IBUPROFEN 800 MG TAB: 800 | 7 days supply | Qty: 30 | Fill #0

## 2019-06-20 MED FILL — SENEXON-S 8.6-50 MG TABS: 8.6-50 | 15 days supply | Qty: 30 | Fill #0

## 2019-06-20 NOTE — Progress Notes (Signed)
Patient called out for assistance with baby stating that baby was crying by the time I went into the room the baby fell back to sleep. I asked mom would she like for baby to be moved closer to her and she stated yes. I pushed the baby closer to her and explained to her how to lower the side in case she wanted to console baby. Dad was sleeping in the chair unable to hear mom or the baby. I stated to mom that if the baby started to cry again to give [place] a call and someone would be in here help. Five minutes later the mom called back out and yelled help. I went back to the room and the baby was crying and the mom stated that she could not wake up dad. I asked her did she want me to wake up dad and she said yes. I asked her what was dads name and she stated Alycia Rossetti, I called Ryans name tap him on the foot and then tapped him on the shoulder and he then woke up. Mom stated that the baby diaper was wet and after Alycia Rossetti woke up I asked if he could assist dad since I was Secondary school teacher. Dad stated yes but was still waking up so I said that I could go ahead and change baby since he was still fussing. As I was changing baby I was waiting for dad to hand me a wipe and the baby began to urine which made mom even angrier. I finished changing baby and swaddled him and calmed baby down and asked Dad did he want to hold baby or I can put baby back in the bassinet. Dad stated that he was okay with holding baby. Before I walked out the room I told mom and dad to call back out if she needed anything. Mom still seemed agitated and stated okay.
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
## 2019-06-20 NOTE — Progress Notes (Signed)
POSTPARTUM PROGRESS NOTE  POD #2  Subjective:  Shawanda Sievert is a 24 y.o. G1P1001 s/p pLTCS at [redacted]w[redacted]d.  She reports significant pain around her incision and difficulty doing any tasks. Has not slept much since delivery. Lochia like a period.   Objective: Blood pressure 140/81, pulse 88, temperature 97.8 F (36.6 C), temperature source Oral, resp. rate 20, height 5\' 3"  (1.6 m), weight 109.8 kg, last menstrual period 09/22/2018, SpO2 100 %, unknown if currently breastfeeding.  Physical Exam:  General: alert, cooperative and no distress Chest: no respiratory distress Heart:regular rate, distal pulses intact Abdomen: soft, nontender,  Uterine Fundus: firm, appropriately tender DVT Evaluation: No calf swelling or tenderness Extremities: no LE edema Skin: warm, dry; incision clean/dry/intact w/ honeycomb dressing in place  Recent Labs    06/18/19 1551 06/19/19 0623  HGB 11.6* 10.5*  HCT 35.0* 31.2*    Assessment/Plan: Aimi Essner is a 24 y.o. G1P1001 s/p pLTCS at [redacted]w[redacted]d for failed IOL.  POD#2 - Pain not well controlled, given one dose PO dilaudid, reassess in PM. Abdominal exam reassuring, H&H appropriate. Suspect significant element of anxiety and sleep deprivation is contributing.   Routine postpartum care  Lovenox for VTE prophylaxis Anemia: asymptomatic  Start po ferrous sulfate Contraception: OCP's Feeding: bottle  Dispo: Plan for discharge POD#3.   LOS: 5 days   [redacted]w[redacted]d, MD/[facility] OB Fellow  06/20/2019, 9:21 AM
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
## 2019-06-20 NOTE — Care Management (Signed)
CM met with patient and father of baby in room to discuss discharge plans.  CM discussed discharge meds and father of baby informed CM that they had paid for the medications and that the [facility] will be delivering meds to the room. Patient stated she had applied for Medicaid but plans to follow up with her case worker after discharge.  Rosita Fire RNC-MNN, BSN Transitions of Care Pediatrics/Women's and [place]

## 2019-06-20 NOTE — Anesthesia Postprocedure Evaluation (Signed)
Anesthesia Post Note  Patient: Christie Yates  Procedure(s) Performed: CESAREAN SECTION (N/A Abdomen)     Patient location during evaluation: PACU Anesthesia Type: Epidural Level of consciousness: awake and alert Pain management: pain level controlled Vital Signs Assessment: post-procedure vital signs reviewed and stable Respiratory status: spontaneous breathing, nonlabored ventilation, respiratory function stable and patient connected to nasal cannula oxygen Cardiovascular status: blood pressure returned to baseline and stable Postop Assessment: no apparent nausea or vomiting and epidural receding Anesthetic complications: no    Last Vitals:  Vitals:   06/19/19 1932 06/20/19 0623  BP: 138/82 140/81  Pulse: 83 88  Resp: 20 20  Temp: 36.8 C 36.6 C  SpO2: 99% 100%    Last Pain:  Vitals:   06/20/19 1106  TempSrc:   PainSc: 4    Pain Goal:                   [name]

## 2019-06-25 ENCOUNTER — Other Ambulatory Visit: Payer: Self-pay

## 2019-06-25 ENCOUNTER — Ambulatory Visit (INDEPENDENT_AMBULATORY_CARE_PROVIDER_SITE_OTHER): Payer: Medicaid Other

## 2019-06-25 VITALS — BP 126/78 | HR 99

## 2019-06-25 DIAGNOSIS — Z0130 Encounter for examination of blood pressure without abnormal findings: Secondary | ICD-10-CM

## 2019-06-25 NOTE — Progress Notes (Signed)
Chart reviewed - agree with CMA/RN documentation.  ° °

## 2019-06-25 NOTE — Progress Notes (Signed)
Pt here today for BP check s/p dx preeclampsia and start of Norvasc 5 mg daily which she states she started on 06/21/19.  Pt reports that she has had some headaches and blurry vision.  Pt observed with facial swelling.  Pt reports last dose of Norvasc 5 mg last night.  BP RA 144/92.  Recheck BP LA manually 126/78.  Dr. Adrian Blackwater assess the pt and recommends that pt continues to monitor for sx's of HTN and if she has the visual disturbances return with a headache to go to MAU.  Pt advised to take Norvasc 5 mg po in the am instead of night time.  I also advised pt that we see her next week on 07/02/19 for incision check and BP check.  Observed incision today- steri strips still placed with no odor, no drainage, mild edema, and no erythema.  Pt reports removal of honeycomb dressing about a day ago.  Pt verbalized understanding to all that was discussed and did have any further questions.   Addison Naegeli, RN 06/25/19

## 2019-06-27 ENCOUNTER — Telehealth: Payer: Self-pay | Admitting: General Practice

## 2019-06-27 NOTE — Telephone Encounter (Signed)
Patient called and left message on nurse voicemail line stating she accidentally lifted something greater than 20 lbs and she noticed the glue is starting to peel off now. She is worried that maybe she damaged something and the area does feel a little sore now.   Called patient and provided reassurance of incision. Patient denies incision opening or severe pain. Reviewed wound care and signs & symptoms of infection with patient. Patient verbalized understanding & had no questions.

## 2019-07-02 ENCOUNTER — Ambulatory Visit: Payer: Medicaid Other

## 2019-07-03 ENCOUNTER — Ambulatory Visit (INDEPENDENT_AMBULATORY_CARE_PROVIDER_SITE_OTHER): Payer: Medicaid Other | Admitting: Medical

## 2019-07-03 ENCOUNTER — Other Ambulatory Visit: Payer: Self-pay

## 2019-07-03 ENCOUNTER — Encounter: Payer: Self-pay | Admitting: Medical

## 2019-07-03 VITALS — BP 124/82 | HR 102 | Wt 212.9 lb

## 2019-07-03 DIAGNOSIS — L03818 Cellulitis of other sites: Secondary | ICD-10-CM

## 2019-07-03 MED ORDER — CLINDAMYCIN HCL 300 MG PO CAPS: 300.0000 mg | ORAL_CAPSULE | Freq: Four times a day (QID) | ORAL | 0 refills | Status: DC

## 2019-07-03 NOTE — Patient Instructions (Signed)

## 2019-07-03 NOTE — Progress Notes (Signed)
Ms. Christie Yates is a 24 y.o. G1P1001 at 2 weeks PP S/P C/S who presents to [facility] today for incision check and BP check. BP normal. Patient on Norvasc, advised to continue until routine PP visit. Incision has had surrounding redness and discomfort x 1 week. Minimal drainage. No bleeding or fever. Pain is well-controlled.   BP 124/82   Pulse (!) 102   Wt 212 lb 14.4 oz (96.6 kg)   LMP 09/22/2018 (Approximate)   Breastfeeding No   BMI 37.71 kg/m  Physical Exam  Vitals reviewed. Constitutional: She is oriented to person, place, and time. She appears well-developed and well-nourished. No distress.  Cardiovascular: Tachycardia present.  Respiratory: Effort normal.  GI: Soft. She exhibits no distension. There is no abdominal tenderness.    Neurological: She is alert and oriented to person, place, and time.  Skin: Skin is warm and dry.  Psychiatric: She has a normal mood and affect.    A: S/P cesarean section  Cellulitis   P:  Rx for Clindamycin sent to patient's pharmacy  Advised to keep area clean and dry at all time  Return in 1 week for recheck or sooner PRN Warning signs for worsening condition discussed   Marny Lowenstein, PA-C 07/03/2019 10:28 AM

## 2019-07-03 NOTE — Progress Notes (Signed)
Pt is here today for incision and BP check s/p c-section on 06/18/19. Incision is open to air with fully approximated edges. Minimal yellow drainage with redness to surrounding area. Pt reports burning to incision site 4/10.  BP today is 124/82, HR 102. Pt reports taking Norvasc 5 mg daily as prescribed; last taken this AM at 0815.  Pt's recent hx reviewed with Vonzella Nipple, PA. Incision assessed by Magnus Sinning, PA and BP reviewed. Provider recommends clindamycin antibiotic and states pt will need to return in 1 week for incision check. Pt to continue current BP medications until PP appt unless there is any change. Good wound care and s/s of infection reviewed with pt.  Fleet Contras RN 07/03/19

## 2019-07-11 ENCOUNTER — Ambulatory Visit: Payer: Medicaid Other

## 2019-07-16 ENCOUNTER — Other Ambulatory Visit: Payer: Self-pay

## 2019-07-16 ENCOUNTER — Ambulatory Visit (INDEPENDENT_AMBULATORY_CARE_PROVIDER_SITE_OTHER): Payer: Medicaid Other

## 2019-07-16 VITALS — BP 126/82 | HR 96 | Wt 209.3 lb

## 2019-07-16 DIAGNOSIS — Z5189 Encounter for other specified aftercare: Secondary | ICD-10-CM

## 2019-07-16 NOTE — Progress Notes (Signed)
Pt here today for incision check s/p c-section and BP check.  Pt reports that she has headaches which Tylenol is effective.  Pt reports that she has had intercourse which caused her to have some vaginal bleeding that has now stopped.  Pt denies any pain.  BP LA 126/82.  Incision well approximated with some mild redness, no odor, no edema, and clear fluid leaking from about 2 mm section left of incision that also had a stitch that was observed to be causing irritation.  Notified Hailey Sparacino, DO who removed remaining 2 stitches that had not dissolved.  Pt tolerated well.  Pt advised to continue to monitor for signs of infection and the provider will evaluate her incision at her pp visit scheduled on 07/27/19.  Pt verbalized understanding.   Addison Naegeli, RN 07/16/19

## 2019-07-16 NOTE — Progress Notes (Signed)
Saw patient and removed two small pieces of suture at the left and middle parts of the incision. Incision otherwise healing well. Discussed with patient how smoking can impair wound healing. Agree with plan of care.   Sayeed Weatherall L, DO 07/16/2019 10:36 AM

## 2019-07-27 ENCOUNTER — Ambulatory Visit (INDEPENDENT_AMBULATORY_CARE_PROVIDER_SITE_OTHER): Payer: Medicaid Other | Admitting: Obstetrics & Gynecology

## 2019-07-27 ENCOUNTER — Encounter: Payer: Self-pay | Admitting: Obstetrics & Gynecology

## 2019-07-27 ENCOUNTER — Other Ambulatory Visit: Payer: Self-pay

## 2019-07-27 DIAGNOSIS — Z30011 Encounter for initial prescription of contraceptive pills: Secondary | ICD-10-CM | POA: Diagnosis not present

## 2019-07-27 DIAGNOSIS — O1494 Unspecified pre-eclampsia, complicating childbirth: Secondary | ICD-10-CM

## 2019-07-27 DIAGNOSIS — B372 Candidiasis of skin and nail: Secondary | ICD-10-CM

## 2019-07-27 LAB — POCT PREGNANCY, URINE: Preg Test, Ur: NEGATIVE

## 2019-07-27 MED ORDER — NYSTATIN 100000 UNIT/GM EX POWD: 1.0000 "application " | Freq: Three times a day (TID) | CUTANEOUS | 2 refills | Status: DC

## 2019-07-27 MED ORDER — NORETHINDRONE ACET-ETHINYL EST 1-20 MG-MCG PO TABS: 1.0000 | ORAL_TABLET | Freq: Every day | ORAL | 11 refills | Status: AC

## 2019-07-27 NOTE — Progress Notes (Signed)
Post Partum Visit Note  Christie Yates is a 24 y.o. G55P1001 female who presents for a postpartum visit. She is 5 weeks postpartum following a primary cesarean section at [redacted]w[redacted]d for FTP.  I have fully reviewed the prenatal and intrapartum course, patient had mild preeclampsia. Postpartum course has been uncomplicated, she has been taking Norvasc 5 mg daily. Baby is doing well. Baby is feeding by bottle - formula. Bleeding no bleeding. Bowel function is normal. Bladder function is normal. Patient is sexually active. Contraception method is none, desires OCPs. Postpartum depression screening: negative.  She reports some redness on the folds of skin around incision, no problems with incision itself.   The following portions of the patient's history were reviewed and updated as appropriate: allergies, current medications, past family history, past medical history, past social history, past surgical history and problem list.  Review of Systems Pertinent items noted in HPI and remainder of comprehensive ROS otherwise negative.    Objective:  Blood pressure 126/87, pulse (!) 105, height 5\' 3"  (1.6 m), weight 210 lb 14.4 oz (95.7 kg), last menstrual period 09/22/2018, not currently breastfeeding.  General:  alert and no distress   Breasts:  deferred  Lungs: clear to auscultation bilaterally  Heart:  regular rate and rhythm  Abdomen: soft, non-tender; bowel sounds normal; no masses,  no organomegaly. Incision C/D/I, no erythema, no drainage, well-healing.  Pannus folds below and above incision with erythema concerning for skin yeast infection, no tenderness.  Pelvic:  not evaluated        Assessment:    Patient is here for postpartum exam.  Plan:  1. Preeclampsia, delivered Will stop Norvasc 5 mg daily for now.   2. Oral contraception initial prescription Low dose COC prescibred. Will return for OCP check and BP check in one month.  UPT neg. Will recheck home UPT next week and start OCPs then if  negative.  Backup contraception recommended for the next week and for two weeks after initiation of OCPs.  - norethindrone-ethinyl estradiol (LOESTRIN 1/20, 21,) 1-20 MG-MCG tablet; Take 1 tablet by mouth daily.  Dispense: 1 Package; Refill: 11  3. Yeast infection of the skin Nystatin powder prescribed. She was told to call for any worsening symptoms.  - nystatin (MYCOSTATIN/NYSTOP) powder; Apply 1 application topically 3 (three) times daily for 10 days.  Dispense: 60 g; Refill: 2  4. Postpartum care following cesarean delivery No other postpartum concerns. Essential components of care per ACOG recommendations:  1.  Mood and well being: Patient with negative depression screening today. Reviewed local resources for support.  - Patient does not use tobacco.  - hx of drug use? No  2. Infant care and feeding:  -Patient currently breastmilk feeding? No  -Social determinants of health (SDOH) reviewed in EPIC. No concerns  3. Sexuality, contraception and birth spacing - Patient does not want a pregnancy in the next year.  Desired family size is unknown.  - Reviewed forms of contraception in tiered fashion. Patient desired oral contraceptives (estrogen/progesterone) today.   - Discussed birth spacing of 18 months  4. Sleep and fatigue -Encouraged family/partner/community support of 4 hrs of uninterrupted sleep to help with mood and fatigue  5. Physical Recovery  - Discussed patients delivery - Patient has urinary incontinence? No - Patient is safe to resume physical and sexual activity  6.  Health Maintenance - Last pap smear done this year at [facility].  7. History of preeclampsia, does not have CHTN - Will follow up  BP   Verita Schneiders, MD [facility] [employer], [place]

## 2019-07-27 NOTE — Patient Instructions (Signed)
Return to clinic for any scheduled appointments or for any gynecologic concerns as needed.   

## 2019-07-30 ENCOUNTER — Telehealth: Payer: Self-pay | Admitting: *Deleted

## 2019-07-30 NOTE — Telephone Encounter (Signed)
Pt left VM message stating that she had C/S on 06/18/19. She has been having black blood from vagina x4 days. Also, she is having pain @ Lt side of abdomen when trying to have a BM. Please call back.

## 2019-07-30 NOTE — Telephone Encounter (Signed)
Called pt; VM left stating I am returning patient's voicemail and our office will attempt to contact patient tomorrow. Callback number given.

## 2019-07-31 NOTE — Telephone Encounter (Signed)
Called pt; VM left stating I am returning pt's phone call and to please call back or reply to MyChart message if pt still has questions. Callback number given. MyChart message sent.

## 2019-08-15 ENCOUNTER — Telehealth (INDEPENDENT_AMBULATORY_CARE_PROVIDER_SITE_OTHER): Payer: Medicaid Other | Admitting: Obstetrics & Gynecology

## 2019-08-15 ENCOUNTER — Telehealth: Payer: Medicaid Other | Admitting: Nurse Practitioner

## 2019-08-15 DIAGNOSIS — L7682 Other postprocedural complications of skin and subcutaneous tissue: Secondary | ICD-10-CM

## 2019-08-15 DIAGNOSIS — Z7689 Persons encountering health services in other specified circumstances: Secondary | ICD-10-CM

## 2019-08-15 NOTE — Telephone Encounter (Signed)
Patient had a C-section two months ago, and is requesting a call back from the clinical staff. She has a question about her incision.

## 2019-08-15 NOTE — Progress Notes (Signed)
Based on what you shared with me it looks like you have incisional isues,that should be evaluated in a face to face office visit. You need to contact your GYN because they may want to see you.    NOTE: If you entered your credit card information for this eVisit, you will not be charged. You may see a "hold" on your card for the $35 but that hold will drop off and you will not have a charge processed.  If you are having a true medical emergency please call 911.

## 2019-08-16 NOTE — Telephone Encounter (Signed)
Called patient stating I am trying to reach her to return her phone call. Patient states yesterday she noticed a spot on her incision that looks almost like a blister and it was bleeding and leaking a white mucous discharge. She has also noticed an increase in pain. Offered nurse visit appt at 850 tomorrow for wound check. Patient verbalized understanding.

## 2019-08-17 ENCOUNTER — Other Ambulatory Visit: Payer: Self-pay

## 2019-08-17 ENCOUNTER — Ambulatory Visit (INDEPENDENT_AMBULATORY_CARE_PROVIDER_SITE_OTHER): Payer: Medicaid Other | Admitting: General Practice

## 2019-08-17 VITALS — BP 137/91 | HR 96 | Ht 63.0 in | Wt 210.0 lb

## 2019-08-17 DIAGNOSIS — Z5189 Encounter for other specified aftercare: Secondary | ICD-10-CM

## 2019-08-17 MED ORDER — ERYTHROMYCIN BASE 250 MG PO TABS: 250.0000 mg | ORAL_TABLET | Freq: Four times a day (QID) | ORAL | 0 refills | Status: DC

## 2019-08-17 NOTE — Addendum Note (Signed)
Addended by: Adam Phenix on: 08/17/2019 11:51 AM   Modules accepted: Orders

## 2019-08-17 NOTE — Progress Notes (Signed)
Patient ID: Christie Yates, female   DOB: [birth year], 24 y.o.   MRN: [id number] Patient seen and assessed by nursing staff during this encounter. I have reviewed the chart and agree with the documentation and plan. I have also made any necessary editorial changes. I inspected the wound and prescribed antibiotic for 5 days Meds ordered this encounter  Medications  . erythromycin (E-MYCIN) 250 MG tablet    Sig: Take 1 tablet (250 mg total) by mouth 4 (four) times daily.    Dispense:  20 tablet    Refill:  0     Scheryl Darter, MD 08/17/2019 11:50 AM

## 2019-08-17 NOTE — Progress Notes (Signed)
Patient presents to office today for wound check. She reports yesterday she noticed incisional bleeding and mucous discharge as well as slight increase in pain. Incision appears well healing but there is an area of serosanguinous drainage, small amount noted. Incision doesn't appear reddened or swollen. Dr Debroah Loop assessed incision and will place orders for antibiotics as a precaution. Patient has previous history of cellulitis of incision 2 weeks after delivery. Wound care reviewed with patient.   Chase Caller RN  BSN 08/17/19

## 2019-08-21 ENCOUNTER — Telehealth: Payer: Self-pay | Admitting: *Deleted

## 2019-08-21 NOTE — Telephone Encounter (Addendum)
Pt left VM message stating that she was seen on 4/9 and prescribed Loestrin however what she received from her pharmacy was Azithromycin. Please call back.   1100 Call returned to pt and verified with her that the antibiotic received was in fact erythromycin (E-mycin) as prescribed and not azithromycin. Pt was also informed that she had plenty of refills available of Loestrin birth control. She voiced understanding.

## 2019-08-27 ENCOUNTER — Other Ambulatory Visit: Payer: Self-pay

## 2019-08-27 ENCOUNTER — Ambulatory Visit (INDEPENDENT_AMBULATORY_CARE_PROVIDER_SITE_OTHER): Payer: Medicaid Other | Admitting: General Practice

## 2019-08-27 VITALS — BP 112/83 | HR 91 | Ht 63.0 in | Wt 211.0 lb

## 2019-08-27 DIAGNOSIS — L0390 Cellulitis, unspecified: Secondary | ICD-10-CM

## 2019-08-27 DIAGNOSIS — Z793 Long term (current) use of hormonal contraceptives: Secondary | ICD-10-CM

## 2019-08-27 DIAGNOSIS — Z0130 Encounter for examination of blood pressure without abnormal findings: Secondary | ICD-10-CM

## 2019-08-27 MED ORDER — FLUCONAZOLE 150 MG PO TABS: 150.0000 mg | ORAL_TABLET | Freq: Once | ORAL | 0 refills | Status: AC

## 2019-08-27 MED ORDER — CLINDAMYCIN HCL 300 MG PO CAPS: 600.0000 mg | ORAL_CAPSULE | Freq: Two times a day (BID) | ORAL | 0 refills | Status: AC

## 2019-08-27 NOTE — Progress Notes (Signed)
Patient presents to office today for blood pressure check following new start on OCPs. Patient reports occasional headaches but they are relieved with rest- this is normal for her since pregnancy. BP is 112/83 today.   Patient reports she is also here for incision check- she did complete antibiotic Rx since last visit. Incision appears to be healing well, no drainage noted. Patient is still quite tender at incision site. Dr Macon Large orders clindamycin for patient and diflucan if needed. Patient informed. Discussed keeping incision clean & dry.  Chase Caller RN BSN 08/27/19

## 2019-08-27 NOTE — Progress Notes (Signed)
Patient seen and assessed by nursing staff during this encounter. I have reviewed the chart and agree with the documentation and plan.  Jaynie Collins, MD 08/27/2019 11:55 AM

## 2019-09-14 ENCOUNTER — Encounter (HOSPITAL_COMMUNITY): Payer: Self-pay

## 2019-09-14 ENCOUNTER — Other Ambulatory Visit: Payer: Self-pay

## 2019-09-14 ENCOUNTER — Emergency Department (HOSPITAL_COMMUNITY)
Admission: EM | Admit: 2019-09-14 | Discharge: 2019-09-14 | Disposition: A | Discharge status: Home / Self Care | Payer: Medicaid Other | Attending: Emergency Medicine | Admitting: Emergency Medicine

## 2019-09-14 DIAGNOSIS — Z9889 Other specified postprocedural states: Secondary | ICD-10-CM | POA: Insufficient documentation

## 2019-09-14 DIAGNOSIS — L7682 Other postprocedural complications of skin and subcutaneous tissue: Secondary | ICD-10-CM | POA: Diagnosis not present

## 2019-09-14 DIAGNOSIS — B369 Superficial mycosis, unspecified: Secondary | ICD-10-CM | POA: Diagnosis not present

## 2019-09-14 DIAGNOSIS — R21 Rash and other nonspecific skin eruption: Secondary | ICD-10-CM | POA: Diagnosis present

## 2019-09-14 NOTE — ED Provider Notes (Signed)
[facility] DEPT Provider Note   CSN: [id number] Arrival date & time: 09/14/19  8469     History Chief Complaint  Patient presents with  . post op issue    Christie Yates is a 24 y.o. female with PMH significant for cesarean section performed 06/18/2019 who presents to the ED with postoperative complaints.  Patient reports that she has been evaluated by her OB/GYN office on numerous occasions subsequent to her cesarean section, but feels as though her complaints are not being heard or addressed.  She states that she was initially prescribed a nystatin powder which she states seemed to improve her symptoms.  However, shortly after discontinuation, her symptoms returned.  I reviewed patient medical record and she was evaluated on 08/17/2019 by Dr. Roselie Awkward OB/GY[facility], and provided erythromycin tablets for her reported incision site bleeding and clear mucous discharge.  She states that she has not taken those antibiotics.  She states that her area is moist and "foul-smelling".  She states as though it smells like a yeast infection.  Patient also reports that it is very pruritic and she tries her best not to scratch the affected area.  She notes mostly clear serous drainage, however thought that she saw a little blood come from an area in her incision.  She has been experiencing low-grade fevers 99 to 100 F for the past week.  She denies any abdominal pain, nausea or vomiting, diminished appetite, urinary symptoms, vaginal discharge or bleeding, or change in bowel habits.  HPI     Past Medical History:  Diagnosis Date  . Asthma   . Carpal tunnel syndrome 2016  . PCOS (polycystic ovarian syndrome) 2017  . Preeclampsia     There are no problems to display for this patient.   Past Surgical History:  Procedure Laterality Date  . CESAREAN SECTION N/A 06/18/2019   Procedure: CESAREAN SECTION;  Surgeon: Osborne Oman, MD;  Location: [facility] LD ORS;  Service: Obstetrics;   Laterality: N/A;  . WISDOM TOOTH EXTRACTION       OB History    Gravida  1   Para  1   Term  1   Preterm      AB      Living  1     SAB      TAB      Ectopic      Multiple  0   Live Births  1           Family History  Problem Relation Age of Onset  . Multiple sclerosis Mother   . Crohn's disease Mother   . Cancer Father     Social History   Tobacco Use  . Smoking status: Former Smoker    Quit date: 04/17/2019    Years since quitting: 0.4  . Smokeless tobacco: Never Used  Substance Use Topics  . Alcohol use: Never  . Drug use: Never    Home Medications Prior to Admission medications   Medication Sig Start Date End Date Taking? Authorizing Provider  acetaminophen (TYLENOL) 500 MG tablet Take 2 tablets (1,000 mg total) by mouth every 8 (eight) hours as needed. Patient taking differently: Take 1,000 mg by mouth every 8 (eight) hours as needed for mild pain or headache.  06/20/19  Yes Fair, Marin Shutter, MD  norethindrone-ethinyl estradiol (LOESTRIN 1/20, 21,) 1-20 MG-MCG tablet Take 1 tablet by mouth daily. 07/27/19  Yes Anyanwu, Sallyanne Havers, MD  nystatin [facility]) powder Apply 1 application topically 3 (  three) times daily.   Yes [provider]  erythromycin (E-MYCIN) 250 MG tablet Take 1 tablet (250 mg total) by mouth 4 (four) times daily. Patient not taking: Reported on 09/14/2019 08/17/19   Adam Phenix, MD    Allergies    Penicillins and Sulfa antibiotics  Review of Systems   Review of Systems  Gastrointestinal: Negative for abdominal pain, nausea and vomiting.  Genitourinary: Negative for pelvic pain and vaginal discharge.  Skin: Positive for rash.  Neurological: Negative for weakness and numbness.     Physical Exam Updated Vital Signs BP 132/87 (BP Location: Left Arm)   Pulse 77   Temp 99.7 F (37.6 C) (Oral)   Resp 16   Ht 5\' 3"  (1.6 m)   Wt 97.2 kg   LMP 08/29/2019 (Approximate)   SpO2 100%   BMI 37.95 kg/m   Physical  Exam Vitals and nursing note reviewed. Exam conducted with a chaperone present.  Constitutional:      General: She is not in acute distress.    Appearance: Normal appearance. She is not ill-appearing.  HENT:     Head: Normocephalic and atraumatic.  Eyes:     General: No scleral icterus.    Conjunctiva/sclera: Conjunctivae normal.  Cardiovascular:     Rate and Rhythm: Normal rate and regular rhythm.     Pulses: Normal pulses.     Heart sounds: Normal heart sounds.  Pulmonary:     Effort: Pulmonary effort is normal. No respiratory distress.     Breath sounds: Normal breath sounds.  Abdominal:     Comments: Soft, nondistended.  No TTP.  No guarding.  Erythematous, scaly rash surrounding the incision site in fold of pannus.  The area is particularly moist and there is an associated odor.  Tiny area of dehiscence in the middle of the incision.  No evidence of bleeding.  Clear serous drainage.  No surrounding induration.  No purulence.  Musculoskeletal:     Cervical back: Normal range of motion.  Skin:    General: Skin is dry.  Neurological:     Mental Status: She is alert.     GCS: GCS eye subscore is 4. GCS verbal subscore is 5. GCS motor subscore is 6.  Psychiatric:        Mood and Affect: Mood normal.        Behavior: Behavior normal.        Thought Content: Thought content normal.        ED Results / Procedures / Treatments   Labs (all labs ordered are listed, but only abnormal results are displayed) Labs Reviewed - No data to display  EKG None  Radiology No results found.  Procedures Procedures (including critical care time)  Medications Ordered in ED Medications - No data to display  ED Course  I have reviewed the triage vital signs and the nursing notes.  Pertinent labs & imaging results that were available during my care of the patient were reviewed by me and considered in my medical decision making (see chart for details).    MDM Rules/Calculators/A&P                       Patient's physical exam is consistent with a fungal infection.  Encouraging her to continue applying the nystatin powder (Nyamyc) as directed.  She states that she had significant improvement with this medication previously, however her symptoms returned after she discontinued.  Her affected area is moist and profusely  itchy, consistent with fungal rash.  There is also malodor that smells similar to a yeast infection.    There is no significant erythema or induration concerning for cellulitis infection.  There is no particular warmth.  While she endorses some mild, low-grade fevers at home, low suspicion for a systemic infection secondary to this postoperative complication.  She does have a mild area of dehiscence (see picture), encouraged her to call her OB/GYN to schedule appointment for early next week.  Tylenol as needed for symptoms of fever.  Do not feel as though laboratory work-up or imaging is warranted.  Dr. Lenna Gilford personally evaluated patient and agrees with assessment and plan.    Strict ED return precautions discussed with the patient.  Patient voices understanding and is agreeable to the plan.  Final Clinical Impression(s) / ED Diagnoses Final diagnoses:  Fungal rash of torso  Other postoperative complication of skin    Rx / DC Orders ED Discharge Orders    None       Lorelee New, PA-C 09/14/19 1521    Milagros Loll, MD 09/16/19 1723

## 2019-09-14 NOTE — ED Triage Notes (Signed)
Patient states she has been having clear drainage and bright red blood from a c section incision tha t is 71 months old. Patient states the drainage has a foul odor. Patient states the area is sore to touch.

## 2019-09-14 NOTE — Discharge Instructions (Signed)
Your rash is consistent with a fungal infection.  Please keep the affected area dry and continue to apply the nystatin powder frequently and as directed.  I would continue to hold off on the prescription of oral erythromycin for now as I have low suspicion for bacterial infection.  You will need to call the office of Dr. Macon Large OB/GYN to schedule appointment for ongoing evaluation and management given your tiny area of dehiscence.    Please return to the ED or seek immediate medical attention should experience any new or worsening symptoms.

## 2019-09-19 ENCOUNTER — Encounter: Payer: Self-pay | Admitting: Women's Health

## 2019-09-19 ENCOUNTER — Ambulatory Visit (INDEPENDENT_AMBULATORY_CARE_PROVIDER_SITE_OTHER): Payer: Medicaid Other | Admitting: Women's Health

## 2019-09-19 ENCOUNTER — Other Ambulatory Visit: Payer: Self-pay

## 2019-09-19 VITALS — BP 125/87 | HR 74 | Wt 219.0 lb

## 2019-09-19 DIAGNOSIS — R030 Elevated blood-pressure reading, without diagnosis of hypertension: Secondary | ICD-10-CM

## 2019-09-19 DIAGNOSIS — Z5189 Encounter for other specified aftercare: Secondary | ICD-10-CM

## 2019-09-19 NOTE — Progress Notes (Signed)
All better just here for a follow up

## 2019-09-19 NOTE — Progress Notes (Signed)
  History:  Ms. Christie Yates is a 24 y.o. G1P1001 who presents to clinic today for wound check. Patient is s/p C/S on 06/18/2019 and has been seen multiples times in the office and once in the ED on 09/14/2019 for a candidal infection of the pannus overlying her C/S and also drainage from her incision that she was prescribed antibiotics for but did not take. Photos from the ED visit on 09/14/2019 show extensive candidal skin infection and patient was instructed to continue with NyStatin poweder, which she did and there is no redness evident on today's exam and the skin appears normal. C/S incision also appears well-healed except for a pinpoint area around the midline of the well-healed scar that is open a draining a minute amount of serosanguinous fluid.  The following portions of the patient's history were reviewed and updated as appropriate: allergies, current medications, family history, past medical history, social history, past surgical history and problem list.  Review of Systems:  Review of Systems  Constitutional: Negative for chills and fever.  Respiratory: Negative for shortness of breath.   Cardiovascular: Negative for chest pain.  Gastrointestinal: Negative for abdominal pain.  Genitourinary: Negative for dysuria, frequency and urgency.     Objective:  Physical Exam BP 125/87   Pulse 74   Wt 219 lb (99.3 kg)   LMP 08/29/2019 (Approximate)   BMI 38.79 kg/m    Physical Exam  Constitutional: She is oriented to person, place, and time. She appears well-developed and well-nourished. No distress.  HENT:  Head: Normocephalic and atraumatic.  Respiratory: Effort normal.  GI: Soft. There is no abdominal tenderness.  C/S incision also appears well-healed except for a pinpoint area around the midline of the well-healed scar that is open a draining a minute amount of serosanguinous fluid.  Neurological: She is alert and oriented to person, place, and time.  Skin: Skin is warm and dry.  She is not diaphoretic.  Psychiatric: She has a normal mood and affect. Her behavior is normal. Judgment normal.   Labs and Imaging No results found for this or any previous visit (from the past 24 hour(s)).  No results found.   Assessment & Plan:   1. Visit for wound check -consulted with Dr. Alysia Yates and silver nitrate applied to area per his recommendations; no further bleeding or drainage noted -patient referred to PCP for evaluation of possible DM -skin cleaned with saline by CMA to remove old nystatin powder  2. Elevated blood pressure reading -initial BP elevated, repeat normal -list of PCPs given for patient to f/u regarding elevated BP and management  Approximately 10 minutes of face-to-face time was spent with this patient   Christie Yates, Christie Sera, NP 09/19/2019 11:19 AM

## 2019-09-19 NOTE — Patient Instructions (Addendum)
AREA FAMILY PRACTICE PHYSICIANS  [place] ([address]) . [facility] o [address] [place], [place] [address] o [phone] o Mon-Fri 8:30-12:30, 1:30-5:00 o Accepting Medicaid . [facility] o [address], [place], [place] [address] o [phone] o Mon-Fri 8:00-5:30 . [facility] o [address] [place], [place] [address] o [phone] o Mon, Tue, Thur, Fri 8:30-5:00, Wed 10:00-7:00 (closed 1-2pm) o Accepting Medicaid . [facility] o [address], [place], [place]  [address] o Phone - [phone]   Fax - [phone]  [place] ([address]) . [facility] o [address] [place], [place] [address] o [phone] o Mon-Fri 8:00-5:00 . [facility]  o [address] [place], [place] [address] o [phone] o Mon-Fri 8:30-5:30, Sat (Oct.-Mar.) 9:00-1:00 o Accepting Medicaid  [place] ([address]) . [facility] o [address], [place], [place] [address] o [phone] o Mon-Fri 8:00-5:00  [place] ([address]) . [facility] o [address], [place], [place] [address] o [phone] o Mon-Fri 8:00-5:00 . [facility] o [address], [place], [place] [address] o [phone] o Mon-Fri 8:00-5:00 . [facility] o [address] [place], [place] [address] o [phone] o Mon-Fri 8:00-5:00 . [facility] o [address] [place] [address] o [phone] o Mon-Fri 7:30-5:30  [place] ([address] & [address]) . [facility] o [address] [place], [place] [address] o [phone] o Mon-Thur 8:00-6:00 o Accepting Medicaid . [facility] o [address] [place], [place] [address] o [phone] o Mon-Thur 7:30-7:30, Fri 7:30-4:30 o Accepting Medicaid . [facility] o [address], [place], [place]  [address] o [phone]   Fax - [phone]  [place] ([address] & [address]) . [facility] o [address] [place], [place] [address] o [phone] o Mon-Fri 7:00-5:00 . [facility] o [address], [place], [place] [address] o [phone] o Mon-Fri 8:00-5:00 o Accepting Medicaid . [facility] o [address],  [place], [place] [address] o [phone] o Mon-Fri 8:00-5:00 o Accepting Medicaid  [place] ([address]) . [facility] [place] o [address] [place], [place] [address] o [phone] o Mon-Fri 8:00-5:00 . [facility] o [address], [place], [place] [address] o [phone] o Mon-Fri 8:00-5:00 o Accepting Medicaid . [facility] o [address], [place], [place] [address] o [phone] o Mon-Fri 8:00-5:30, Sat&Sun by appointment (phones open at 8:30) o Accepting Medicaid  [place] ([address]) . [facility] o [address] [place], [place] [address] o [phone] o Mon-Thur 8:00-7:00, Fri 8:00-5:00, Sat 8:00-12:00, Sun 9:00-12:00 o Accepting Medicaid . [facility] [place] o [address], [place], [place] [address] o [phone] o Mon-Thur 8:00-5:00 o Accepting Medicaid . [facility] o [address] [place],  [address] o [phone] o Mon-Fri 8:00-5:30, Sat (Oct.-Mar.) 9:00-1:00 o Accepting Medicaid  [place]  ([address]) .   [place] o [address] [place], [place], [place] [address] o [phone] o Mon-Fri 8:00-5:00 o Accepting Medicaid   [facility] [phone]) . [facility] [place], [place], [place] [address] o [phone] o Mon-Fri 8:00-5:00 . Therapist, music at [facility] o [address], [place], [place] [address] o [phone] o Mon-Fri 8:00-5:00 . [address], [place], [place] [address] o [phone] o Mon-Fri 8:00-5:00 o After hours clinic [facility] [place], [place] [address]) [phone] Mon-Fri 5:00-8:00, Sat 12:00-6:00, Sun 10:00-4:00 o Accepting Medicaid . [facility] o [address], [place], [place]  [address] o [phone]   Fax - [phone]  [place] [phone]) . Therapist, music at [place] o [address] [place], [place] [address] o [phone] o Mon-Fri 8:00-5:00 . [place] ([facility] o [place] [address], [place], [place] [address] o [phone] o Mon-Thur 8:00-7:00, Fri 8:00-5:00, Sat 8:00-12:00           Wound Infection A wound infection happens when tiny organisms (microorganisms) start to grow in a wound. A wound infection is most often caused by bacteria. Infection can cause the wound to break open or worsen. Wound infection needs treatment. If a wound infection is left untreated, complications can occur. Untreated wound infections may lead to an infection in the bloodstream (septicemia) or a bone infection (osteomyelitis). What are the causes? This condition is most often caused by bacteria growing in a wound. Other microorganisms, like yeast and fungi, can also cause wound infections. What increases the risk? The following factors may make you more likely to develop this condition:  Having a weak body defense system  (immune system).  Having diabetes.  Taking steroid medicines for a long time (chronic use).  Smoking.  Being an older person.  Being overweight.  Taking chemotherapy medicines. What are the signs or symptoms? Symptoms of this condition include:  Having more redness, swelling, or pain at the wound site.  Having more blood or fluid at the wound site.  A bad smell coming from a wound or bandage (dressing).  Having a fever.  Feeling tired or fatigued.  Having warmth at or around the wound.  Having pus at the wound site. How is this diagnosed? This condition is diagnosed with a medical history and physical exam. You may also have a wound culture or blood tests or both. How is this treated? This condition is usually treated with an antibiotic medicine.  The infection should improve 24-48 hours after you start antibiotics.  After 24-48 hours, redness around the wound should stop spreading, and the wound should be less painful. Follow these instructions at home: Medicines  Take or apply over-the-counter and prescription medicines only as told by your health care provider.  If you were prescribed an antibiotic medicine, take or apply it as told by your health care provider. Do not stop using the antibiotic even if you start to feel better. Wound care   Clean the wound each day, or as told by your health care provider. ? Wash the wound with mild soap and water. ? Rinse the wound with water to remove all soap. ? Pat the wound dry with a clean towel. Do not rub it.  Follow instructions from your health care provider about how to take care of your  wound. Make sure you: ? Wash your hands with soap and water before and after you change your dressing. If soap and water are not available, use hand sanitizer. ? Change your dressing as told by your health care provider. ? Leave stitches (sutures), skin glue, or adhesive strips in place if your wound has been closed. These skin  closures may need to stay in place for 2 weeks or longer. If adhesive strip edges start to loosen and curl up, you may trim the loose edges. Do not remove adhesive strips completely unless your health care provider tells you to do that. Some wounds are left open to heal on their own.  Check your wound every day for signs of infection. Watch for: ? More redness, swelling, or pain. ? More fluid or blood. ? Warmth. ? Pus or a bad smell. General instructions  Keep the dressing dry until your health care provider says it can be removed.  Do not take baths, swim, or use a hot tub until your health care provider approves. Ask your health care provider if you may take showers. You may only be allowed to take sponge baths.  Raise (elevate) the injured area above the level of your heart while you are sitting or lying down.  Do not scratch or pick at the wound.  Keep all follow-up visits as told by your health care provider. This is important. Contact a health care provider if:  Your pain is not controlled with medicine.  You have more redness, swelling, or pain around your wound.  You have more fluid or blood coming from your wound.  Your wound feels warm to the touch.  You have pus coming from your wound.  You continue to notice a bad smell coming from your wound or your dressing.  Your wound that was closed breaks open. Get help right away if:  You have a red streak going away from your wound.  You have a fever. Summary  A wound infection happens when tiny organisms (microorganisms) start to grow in a wound.  This condition is usually treated with an antibiotic medicine.  Follow instructions from your health care provider about how to take care of your wound.  Contact a health care provider if your wound infection does not begin to improve in 24-48 hours, or your symptoms worsen.  Keep all follow-up visits as told by your health care provider. This is important. This  information is not intended to replace advice given to you by your health care provider. Make sure you discuss any questions you have with your health care provider. Document Revised: 12/06/2017 Document Reviewed: 12/06/2017 Elsevier Patient Education  2020 [employer].

## 2019-09-24 DIAGNOSIS — L7682 Other postprocedural complications of skin and subcutaneous tissue: Secondary | ICD-10-CM

## 2019-09-24 DIAGNOSIS — R1084 Generalized abdominal pain: Secondary | ICD-10-CM

## 2019-09-24 MED ORDER — IBUPROFEN 800 MG PO TABS: 800.0000 mg | ORAL_TABLET | Freq: Three times a day (TID) | ORAL | 0 refills | Status: AC | PRN

## 2019-10-01 ENCOUNTER — Other Ambulatory Visit: Payer: Self-pay | Admitting: Obstetrics and Gynecology

## 2019-10-01 ENCOUNTER — Ambulatory Visit (HOSPITAL_COMMUNITY)
Admission: RE | Admit: 2019-10-01 | Discharge: 2019-10-01 | Disposition: A | Discharge status: Home / Self Care | Payer: Medicaid Other | Source: Ambulatory Visit | Attending: Obstetrics and Gynecology | Admitting: Obstetrics and Gynecology

## 2019-10-01 ENCOUNTER — Other Ambulatory Visit: Payer: Self-pay

## 2019-10-01 DIAGNOSIS — R1084 Generalized abdominal pain: Secondary | ICD-10-CM | POA: Diagnosis not present

## 2019-10-01 DIAGNOSIS — L7682 Other postprocedural complications of skin and subcutaneous tissue: Secondary | ICD-10-CM

## 2019-10-01 MED ORDER — IOHEXOL 300 MG/ML  SOLN
100.0000 mL | Freq: Once | INTRAMUSCULAR | Status: AC | PRN
  Administered 2019-10-01: 100 mL via INTRAVENOUS

## 2019-10-23 ENCOUNTER — Encounter: Payer: Self-pay | Admitting: Obstetrics and Gynecology

## 2019-10-23 ENCOUNTER — Ambulatory Visit (INDEPENDENT_AMBULATORY_CARE_PROVIDER_SITE_OTHER): Payer: Medicaid Other | Admitting: Obstetrics and Gynecology

## 2019-10-23 ENCOUNTER — Other Ambulatory Visit: Payer: Self-pay

## 2019-10-23 DIAGNOSIS — B372 Candidiasis of skin and nail: Secondary | ICD-10-CM

## 2019-10-23 MED ORDER — CLOTRIMAZOLE-BETAMETHASONE 1-0.05 % EX CREA: 1.0000 "application " | TOPICAL_CREAM | Freq: Two times a day (BID) | CUTANEOUS | 1 refills | Status: AC

## 2019-10-23 NOTE — Progress Notes (Signed)
Ms Christie Yates presents for follow up of her incision. She has been seen several times for problems with her incision. See prior notes. She had a CT scan which was unremarkable. Pt reports today feeling much better. She has notice some skin redness since she ran out of Nystatin. She denies pain, bowel, bladder dysfunction or problems with IC.  PE AF VSS Lungs clear Heart RRR Abd soft + BS small pannus over incision skin irriation consistent with yeast note, incision well healed and non tender and no drainage   A/P Yeast Dermatitis  Discussed with pt. Will check Hgb A1c. Lotrisone cream PRN instead of Nystatin. To follow up with PCP as needed.

## 2019-10-23 NOTE — Patient Instructions (Signed)
Health Maintenance, Female Adopting a healthy lifestyle and getting preventive care are important in promoting health and wellness. Ask your health care provider about:  The right schedule for you to have regular tests and exams.  Things you can do on your own to prevent diseases and keep yourself healthy. What should I know about diet, weight, and exercise? Eat a healthy diet   Eat a diet that includes plenty of vegetables, fruits, low-fat dairy products, and lean protein.  Do not eat a lot of foods that are high in solid fats, added sugars, or sodium. Maintain a healthy weight Body mass index (BMI) is used to identify weight problems. It estimates body fat based on height and weight. Your health care provider can help determine your BMI and help you achieve or maintain a healthy weight. Get regular exercise Get regular exercise. This is one of the most important things you can do for your health. Most adults should:  Exercise for at least 150 minutes each week. The exercise should increase your heart rate and make you sweat (moderate-intensity exercise).  Do strengthening exercises at least twice a week. This is in addition to the moderate-intensity exercise.  Spend less time sitting. Even light physical activity can be beneficial. Watch cholesterol and blood lipids Have your blood tested for lipids and cholesterol at 25 years of age, then have this test every 5 years. Have your cholesterol levels checked more often if:  Your lipid or cholesterol levels are high.  You are older than 25 years of age.  You are at high risk for heart disease. What should I know about cancer screening? Depending on your health history and family history, you may need to have cancer screening at various ages. This may include screening for:  Breast cancer.  Cervical cancer.  Colorectal cancer.  Skin cancer.  Lung cancer. What should I know about heart disease, diabetes, and high blood  pressure? Blood pressure and heart disease  High blood pressure causes heart disease and increases the risk of stroke. This is more likely to develop in people who have high blood pressure readings, are of African descent, or are overweight.  Have your blood pressure checked: ? Every 3-5 years if you are 18-39 years of age. ? Every year if you are 40 years old or older. Diabetes Have regular diabetes screenings. This checks your fasting blood sugar level. Have the screening done:  Once every three years after age 40 if you are at a normal weight and have a low risk for diabetes.  More often and at a younger age if you are overweight or have a high risk for diabetes. What should I know about preventing infection? Hepatitis B If you have a higher risk for hepatitis B, you should be screened for this virus. Talk with your health care provider to find out if you are at risk for hepatitis B infection. Hepatitis C Testing is recommended for:  Everyone born from 1945 through 1965.  Anyone with known risk factors for hepatitis C. Sexually transmitted infections (STIs)  Get screened for STIs, including gonorrhea and chlamydia, if: ? You are sexually active and are younger than 24 years of age. ? You are older than 24 years of age and your health care provider tells you that you are at risk for this type of infection. ? Your sexual activity has changed since you were last screened, and you are at increased risk for chlamydia or gonorrhea. Ask your health care provider if   you are at risk.  Ask your health care provider about whether you are at high risk for HIV. Your health care provider may recommend a prescription medicine to help prevent HIV infection. If you choose to take medicine to prevent HIV, you should first get tested for HIV. You should then be tested every 3 months for as long as you are taking the medicine. Pregnancy  If you are about to stop having your period (premenopausal) and  you may become pregnant, seek counseling before you get pregnant.  Take 400 to 800 micrograms (mcg) of folic acid every day if you become pregnant.  Ask for birth control (contraception) if you want to prevent pregnancy. Osteoporosis and menopause Osteoporosis is a disease in which the bones lose minerals and strength with aging. This can result in bone fractures. If you are 65 years old or older, or if you are at risk for osteoporosis and fractures, ask your health care provider if you should:  Be screened for bone loss.  Take a calcium or vitamin D supplement to lower your risk of fractures.  Be given hormone replacement therapy (HRT) to treat symptoms of menopause. Follow these instructions at home: Lifestyle  Do not use any products that contain nicotine or tobacco, such as cigarettes, e-cigarettes, and chewing tobacco. If you need help quitting, ask your health care provider.  Do not use street drugs.  Do not share needles.  Ask your health care provider for help if you need support or information about quitting drugs. Alcohol use  Do not drink alcohol if: ? Your health care provider tells you not to drink. ? You are pregnant, may be pregnant, or are planning to become pregnant.  If you drink alcohol: ? Limit how much you use to 0-1 drink a day. ? Limit intake if you are breastfeeding.  Be aware of how much alcohol is in your drink. In the [place] one drink equals one 12 oz bottle of beer (355 mL), one 5 oz glass of wine (148 mL), or one 1 oz glass of hard liquor (44 mL). General instructions  Schedule regular health, dental, and eye exams.  Stay current with your vaccines.  Tell your health care provider if: ? You often feel depressed. ? You have ever been abused or do not feel safe at home. Summary  Adopting a healthy lifestyle and getting preventive care are important in promoting health and wellness.  Follow your health care provider's instructions about healthy  diet, exercising, and getting tested or screened for diseases.  Follow your health care provider's instructions on monitoring your cholesterol and blood pressure. This information is not intended to replace advice given to you by your health care provider. Make sure you discuss any questions you have with your health care provider. Document Revised: 04/19/2018 Document Reviewed: 04/19/2018 Elsevier Patient Education  2020 [employer]  

## 2019-10-24 LAB — HEMOGLOBIN A1C
Est. average glucose Bld gHb Est-mCnc: 103 mg/dL
Hgb A1c MFr Bld: 5.2 % (ref 4.8–5.6)

## 2019-10-31 ENCOUNTER — Other Ambulatory Visit: Payer: Self-pay

## 2019-10-31 MED ORDER — NYSTATIN 100000 UNIT/GM EX POWD: 1.0000 "application " | Freq: Three times a day (TID) | CUTANEOUS | 1 refills | Status: AC

## 2019-11-22 ENCOUNTER — Other Ambulatory Visit: Payer: Self-pay | Admitting: *Deleted

## 2019-11-22 DIAGNOSIS — N898 Other specified noninflammatory disorders of vagina: Secondary | ICD-10-CM

## 2019-11-22 DIAGNOSIS — N949 Unspecified condition associated with female genital organs and menstrual cycle: Secondary | ICD-10-CM

## 2019-11-22 MED ORDER — FLUCONAZOLE 150 MG PO TABS: 150.0000 mg | ORAL_TABLET | Freq: Once | ORAL | 0 refills | Status: AC

## 2019-12-03 ENCOUNTER — Ambulatory Visit (HOSPITAL_COMMUNITY)
Admission: EM | Admit: 2019-12-03 | Discharge: 2019-12-03 | Disposition: A | Discharge status: Home / Self Care | Payer: Medicaid Other | Attending: Family Medicine | Admitting: Family Medicine

## 2019-12-03 ENCOUNTER — Other Ambulatory Visit: Payer: Self-pay

## 2019-12-03 ENCOUNTER — Encounter (HOSPITAL_COMMUNITY): Payer: Self-pay

## 2019-12-03 DIAGNOSIS — N760 Acute vaginitis: Secondary | ICD-10-CM | POA: Diagnosis not present

## 2019-12-03 DIAGNOSIS — Z3202 Encounter for pregnancy test, result negative: Secondary | ICD-10-CM | POA: Diagnosis not present

## 2019-12-03 LAB — POCT URINALYSIS DIP (DEVICE)
Bilirubin Urine: NEGATIVE
Glucose, UA: NEGATIVE mg/dL
Hgb urine dipstick: NEGATIVE
Ketones, ur: NEGATIVE mg/dL
Leukocytes,Ua: NEGATIVE
Nitrite: NEGATIVE
Protein, ur: NEGATIVE mg/dL
Specific Gravity, Urine: >=1.03 (ref 1.005–1.030)
Urobilinogen, UA: 0.2 mg/dL (ref 0.0–1.0)
pH: 6 (ref 5.0–8.0)

## 2019-12-03 LAB — POC URINE PREG, ED: Preg Test, Ur: NEGATIVE

## 2019-12-03 MED ORDER — FLUCONAZOLE 150 MG PO TABS: 150.0000 mg | ORAL_TABLET | Freq: Every day | ORAL | 0 refills | Status: AC

## 2019-12-03 MED ORDER — TRIAMCINOLONE ACETONIDE 0.1 % EX CREA: 1.0000 "application " | TOPICAL_CREAM | Freq: Two times a day (BID) | CUTANEOUS | 0 refills | Status: AC

## 2019-12-03 MED ORDER — METRONIDAZOLE 500 MG PO TABS: 500.0000 mg | ORAL_TABLET | Freq: Two times a day (BID) | ORAL | 0 refills | Status: AC

## 2019-12-03 NOTE — ED Triage Notes (Signed)
Pt is here with vaginal burning, irritation that started 2 weeks ago, pt has used Monistat, AZO to relieve discomfort.

## 2019-12-03 NOTE — Discharge Instructions (Signed)
Take 1 diflucan today, complete metronidazole and then take additional dose of diflucan Use cream topically on external vagina  Use unscented plain soaps only.   Follow up with Ob/gyn or your primary care  if not improving

## 2019-12-03 NOTE — ED Provider Notes (Signed)
[facility]    CSN: [id number] Arrival date & time: 12/03/19  0901      History   Chief Complaint Chief Complaint  Patient presents with  . Vaginitis    HPI Christie Yates is a 24 y.o. female.   Patient presents with 2 weeks of vaginal irritation and discharge. Was treated for yeast 10 days ago and tried OTC monistat, mild initial improvement but has since returned.  Endorses a white discharge, that is a thick liquid.  She reports irritation and pain on the external genital, but denies significant internal pain.  No abdominal pain.  No painful urination, frequency or urgency.  No vaginal bleeding.  Denies nausea or vomiting.  She does note she recently tried some novelty soaps that she was gifted likely within the last 2 to 4 weeks.     Past Medical History:  Diagnosis Date  . Asthma   . Carpal tunnel syndrome 2016  . PCOS (polycystic ovarian syndrome) 2017  . Preeclampsia     Patient Active Problem List   Diagnosis Date Noted  . Yeast dermatitis 10/23/2019    Past Surgical History:  Procedure Laterality Date  . CESAREAN SECTION N/A 06/18/2019   Procedure: CESAREAN SECTION;  Surgeon: Tereso Newcomer, MD;  Location: [facility];  Service: Obstetrics;  Laterality: N/A;  . WISDOM TOOTH EXTRACTION      OB History    Gravida  1   Para  1   Term  1   Preterm      AB      Living  1     SAB      TAB      Ectopic      Multiple  0   Live Births  1            Home Medications    Prior to Admission medications   Medication Sig Start Date End Date Taking? Authorizing Provider  clotrimazole-betamethasone (LOTRISONE) cream Apply 1 application topically 2 (two) times daily. 10/23/19   Hermina Staggers, MD  fluconazole (DIFLUCAN) 150 MG tablet Take 1 tablet (150 mg total) by mouth daily for 2 days. 12/03/19 12/05/19  Kemya Shed, Veryl Speak, PA-C  ibuprofen (ADVIL) 800 MG tablet Take 1 tablet (800 mg total) by mouth every 8 (eight) hours as needed. 09/24/19    Hermina Staggers, MD  metroNIDAZOLE (FLAGYL) 500 MG tablet Take 1 tablet (500 mg total) by mouth 2 (two) times daily. 12/03/19   Yazeed Pryer, Veryl Speak, PA-C  norethindrone-ethinyl estradiol (LOESTRIN 1/20, 21,) 1-20 MG-MCG tablet Take 1 tablet by mouth daily. 07/27/19   Anyanwu, Jethro Bastos, MD  nystatin [facility]) powder Apply 1 application topically 3 (three) times daily. 10/31/19   Hermina Staggers, MD  triamcinolone cream (KENALOG) 0.1 % Apply 1 application topically 2 (two) times daily. 12/03/19   Kyndle Schlender, Veryl Speak, PA-C    Family History Family History  Problem Relation Age of Onset  . Multiple sclerosis Mother   . Crohn's disease Mother   . Cancer Father     Social History Social History   Tobacco Use  . Smoking status: Former Smoker    Types: Cigarettes    Quit date: 04/17/2019    Years since quitting: 0.6  . Smokeless tobacco: Never Used  Vaping Use  . Vaping Use: Never used  Substance Use Topics  . Alcohol use: Never  . Drug use: Never     Allergies   Penicillins and Sulfa antibiotics  Review of Systems Review of Systems   Physical Exam Triage Vital Signs ED Triage Vitals  Enc Vitals Group     BP 12/03/19 0933 (!) 143/92     Pulse Rate 12/03/19 0933 90     Resp 12/03/19 0933 18     Temp 12/03/19 0933 98.6 F (37 C)     Temp Source 12/03/19 0933 Oral     SpO2 12/03/19 0933 100 %     Weight 12/03/19 0936 (!) 217 lb 9.6 oz (98.7 kg)     Height --      Head Circumference --      Peak Flow --      Pain Score 12/03/19 0930 0     Pain Loc --      Pain Edu? --      Excl. in GC? --    No data found.  Updated Vital Signs BP (!) 143/92 (BP Location: Left Arm) Comment: notified provider  Pulse 90   Temp 98.6 F (37 C) (Oral)   Resp 18   Wt (!) 217 lb 9.6 oz (98.7 kg)   LMP 11/03/2019   SpO2 100%   Breastfeeding No   BMI 38.55 kg/m   Visual Acuity Right Eye Distance:   Left Eye Distance:   Bilateral Distance:    Right Eye Near:   Left Eye Near:    Bilateral  Near:     Physical Exam Vitals and nursing note reviewed. Exam conducted with a chaperone present.  Constitutional:      General: She is not in acute distress.    Appearance: Normal appearance. She is not ill-appearing.  Cardiovascular:     Rate and Rhythm: Normal rate.  Pulmonary:     Effort: Pulmonary effort is normal. No respiratory distress.  Abdominal:     General: Abdomen is flat.     Tenderness: There is no abdominal tenderness. There is no right CVA tenderness or left CVA tenderness.  Genitourinary:    General: Normal vulva.     Labia:        Right: Rash and tenderness present.        Left: Rash and tenderness present.      Vagina: Vaginal discharge present. No tenderness or bleeding.     Comments: White, thick liquid discharge present  No cervical irritation, bleeding or discharge in the os  No CMT or adenexal TTP Neurological:     Mental Status: She is alert.      UC Treatments / Results  Labs (all labs ordered are listed, but only abnormal results are displayed) Labs Reviewed  POCT URINALYSIS DIP (DEVICE)  POC URINE PREG, ED  CERVICOVAGINAL ANCILLARY ONLY    EKG   Radiology No results found.  Procedures Procedures (including critical care time)  Medications Ordered in UC Medications - No data to display  Initial Impression / Assessment and Plan / UC Course  I have reviewed the triage vital signs and the nursing notes.  Pertinent labs & imaging results that were available during my care of the patient were reviewed by me and considered in my medical decision making (see chart for details).     #Vaginitis Patient is a 24 year old presenting with vaginitis and vulvovaginitis.  UA and pregnancy negative.  Swab sent.  Given recent yeast treatments, some concern for rebound bacterial vaginosis.  We will treat her with Diflucan and metronidazole.  Topical triamcinolone for external symptoms and instructed to use unscented plain soaps.  Instructed to  follow-up  with primary care or OB/GYN if not improving.  Patient verbalized understanding plan of care. Final Clinical Impressions(s) / UC Diagnoses   Final diagnoses:  Vaginitis and vulvovaginitis     Discharge Instructions     Take 1 diflucan today, complete metronidazole and then take additional dose of diflucan Use cream topically on external vagina  Use unscented plain soaps only.   Follow up with Ob/gyn or your primary care  if not improving    ED Prescriptions    Medication Sig Dispense Auth. Provider   triamcinolone cream (KENALOG) 0.1 % Apply 1 application topically 2 (two) times daily. 30 g Kenwood Rosiak, Veryl Speak, PA-C   fluconazole (DIFLUCAN) 150 MG tablet Take 1 tablet (150 mg total) by mouth daily for 2 days. 2 tablet Teandre Hamre, Veryl Speak, PA-C   metroNIDAZOLE (FLAGYL) 500 MG tablet Take 1 tablet (500 mg total) by mouth 2 (two) times daily. 14 tablet Jalaya Sarver, Veryl Speak, PA-C     PDMP not reviewed this encounter.   Hermelinda Medicus, PA-C 12/04/19 [phone]

## 2019-12-04 LAB — CERVICOVAGINAL ANCILLARY ONLY
Bacterial Vaginitis (gardnerella): POSITIVE — AB
Candida Glabrata: NEGATIVE
Candida Vaginitis: NEGATIVE
Chlamydia: NEGATIVE
Comment: NEGATIVE
Comment: NEGATIVE
Comment: NEGATIVE
Comment: NEGATIVE
Comment: NEGATIVE
Comment: NORMAL
Neisseria Gonorrhea: NEGATIVE
Trichomonas: NEGATIVE

## 2020-02-05 ENCOUNTER — Emergency Department (HOSPITAL_COMMUNITY)
Admission: EM | Admit: 2020-02-05 | Discharge: 2020-02-05 | Disposition: A | Discharge status: Home / Self Care | Payer: Medicaid Other | Attending: Emergency Medicine | Admitting: Emergency Medicine

## 2020-02-05 ENCOUNTER — Other Ambulatory Visit: Payer: Self-pay

## 2020-02-05 ENCOUNTER — Encounter (HOSPITAL_COMMUNITY): Payer: Self-pay

## 2020-02-05 DIAGNOSIS — Z5321 Procedure and treatment not carried out due to patient leaving prior to being seen by health care provider: Secondary | ICD-10-CM | POA: Insufficient documentation

## 2020-02-05 DIAGNOSIS — R109 Unspecified abdominal pain: Secondary | ICD-10-CM | POA: Diagnosis present

## 2020-02-05 DIAGNOSIS — R229 Localized swelling, mass and lump, unspecified: Secondary | ICD-10-CM | POA: Diagnosis not present

## 2020-02-05 LAB — I-STAT BETA HCG BLOOD, ED (MC, WL, AP ONLY): I-stat hCG, quantitative: <5 m[IU]/mL (ref <5)

## 2020-02-05 LAB — COMPREHENSIVE METABOLIC PANEL
ALT: 26 U/L (ref 0–44)
AST: 20 U/L (ref 15–41)
Albumin: 4.9 g/dL (ref 3.5–5.0)
Alkaline Phosphatase: 85 U/L (ref 38–126)
Anion gap: 10 (ref 5–15)
BUN: 17 mg/dL (ref 6–20)
CO2: 25 mmol/L (ref 22–32)
Calcium: 9.4 mg/dL (ref 8.9–10.3)
Chloride: 102 mmol/L (ref 98–111)
Creatinine, Ser: 0.7 mg/dL (ref 0.44–1.00)
GFR calc Af Amer: >60 mL/min (ref >60)
GFR calc non Af Amer: >60 mL/min (ref >60)
Glucose, Bld: 97 mg/dL (ref 70–99)
Potassium: 4.4 mmol/L (ref 3.5–5.1)
Sodium: 137 mmol/L (ref 135–145)
Total Bilirubin: 0.2 mg/dL — ABNORMAL LOW (ref 0.3–1.2)
Total Protein: 8.4 g/dL — ABNORMAL HIGH (ref 6.5–8.1)

## 2020-02-05 LAB — URINALYSIS, ROUTINE W REFLEX MICROSCOPIC
Bilirubin Urine: NEGATIVE
Glucose, UA: NEGATIVE mg/dL
Hgb urine dipstick: NEGATIVE
Ketones, ur: NEGATIVE mg/dL
Leukocytes,Ua: NEGATIVE
Nitrite: NEGATIVE
Protein, ur: NEGATIVE mg/dL
Specific Gravity, Urine: 1.016 (ref 1.005–1.030)
pH: 6 (ref 5.0–8.0)

## 2020-02-05 LAB — CBC
HCT: 45.3 % (ref 36.0–46.0)
Hemoglobin: 15.1 g/dL — ABNORMAL HIGH (ref 12.0–15.0)
MCH: 29.7 pg (ref 26.0–34.0)
MCHC: 33.3 g/dL (ref 30.0–36.0)
MCV: 89 fL (ref 80.0–100.0)
Platelets: 405 10*3/uL — ABNORMAL HIGH (ref 150–400)
RBC: 5.09 MIL/uL (ref 3.87–5.11)
RDW: 13.4 % (ref 11.5–15.5)
WBC: 12.9 10*3/uL — ABNORMAL HIGH (ref 4.0–10.5)
nRBC: 0 % (ref 0.0–0.2)

## 2020-02-05 LAB — LIPASE, BLOOD: Lipase: 30 U/L (ref 11–51)

## 2020-02-05 NOTE — ED Triage Notes (Signed)
Patient states she has had night sweats and wakes up with abdominal cramping x 2 month. Patient states she has had 2 large axillary lumps with discoloration of the arm pit x 2 months as well.

## 2020-03-24 ENCOUNTER — Telehealth: Payer: Medicaid Other | Admitting: Internal Medicine

## 2022-04-25 IMAGING — CT CT ABD-PELV W/ CM
2 of 4 series · 17 of 46 positions shown, 19 images · IV contrast (omnipaque)
Comparison: None.

CLINICAL DATA: Generalized abdominal pain for 3 months. Low-grade
fevers. Approximately 3 months status post cesarean section
delivery.

EXAM:
CT ABDOMEN AND PELVIS WITH CONTRAST
TECHNIQUE: Multidetector CT imaging of the abdomen and pelvis was performed
using the standard protocol following bolus administration of
intravenous contrast.
CONTRAST:  100mL OMNIPAQUE IOHEXOL 300 MG/ML  SOLN

[Series 3: a/p w/ 5mm · axial · 0.98mm/px · z∈[+826,+1321]mm · 14 of 109 slices shown, 16 images]
[im 5/109  soft-tissue]
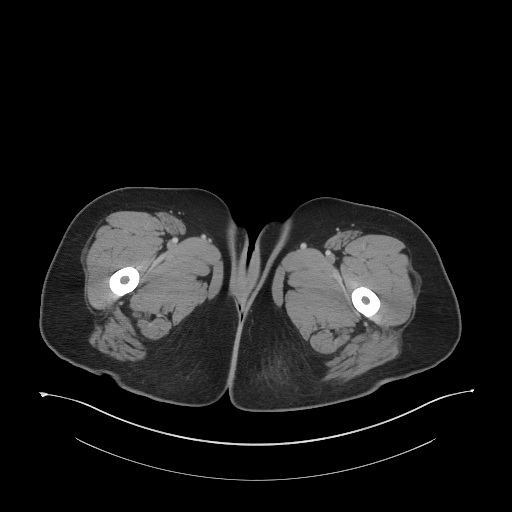
[im 5/109  bone]
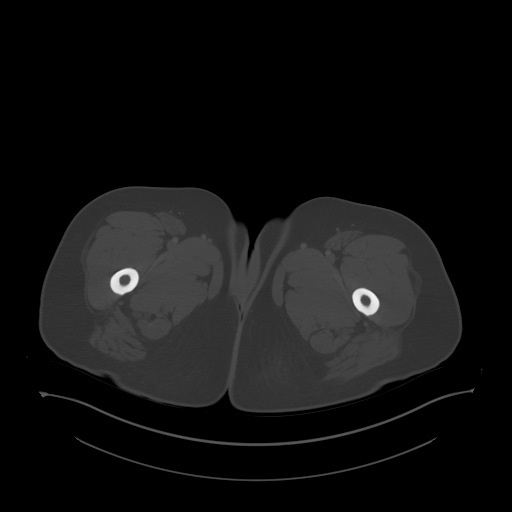
[im 14/109  soft-tissue]
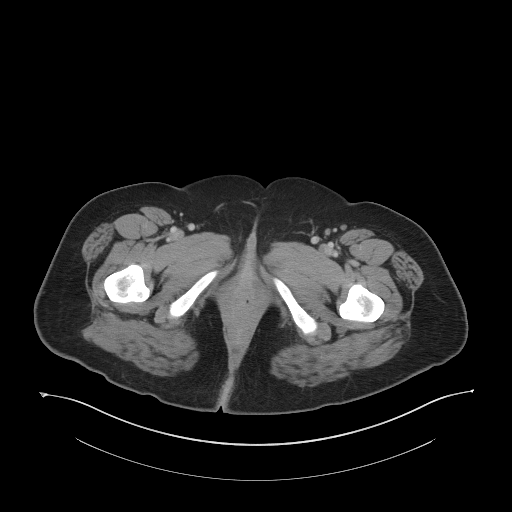
[im 23/109  soft-tissue]
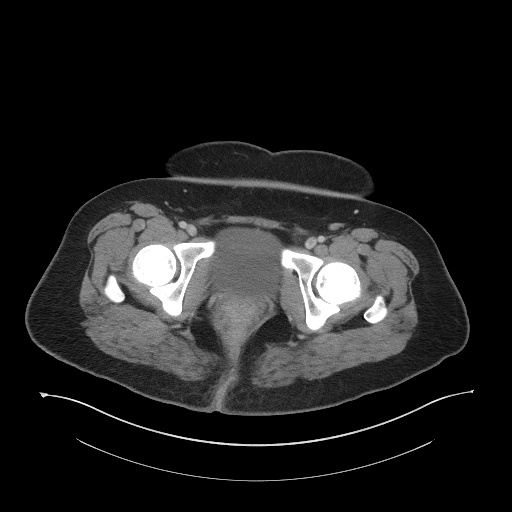
[im 28/109  soft-tissue]
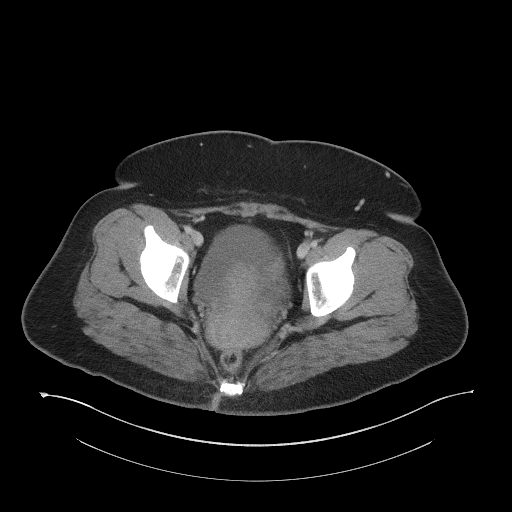
[im 37/109  soft-tissue]
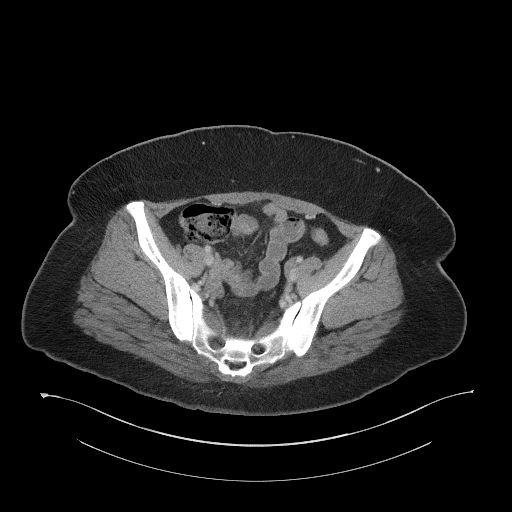
[im 46/109  soft-tissue]
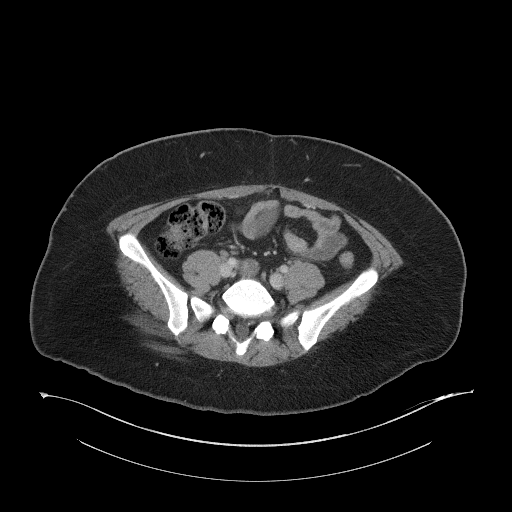
[im 50/109  soft-tissue]
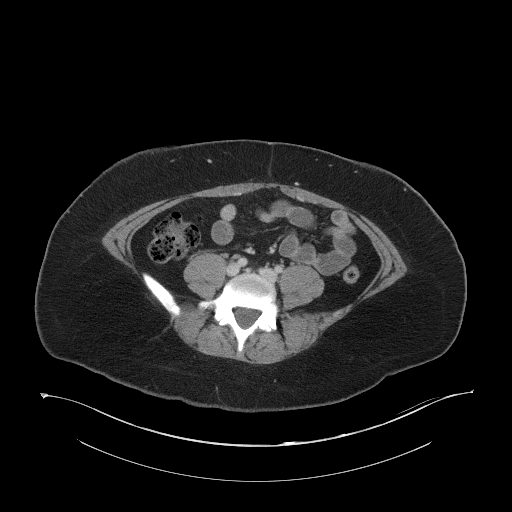
[im 59/109  soft-tissue]
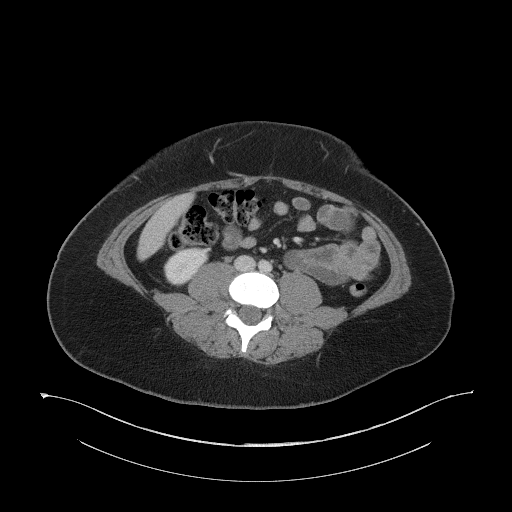
[im 64/109  soft-tissue]
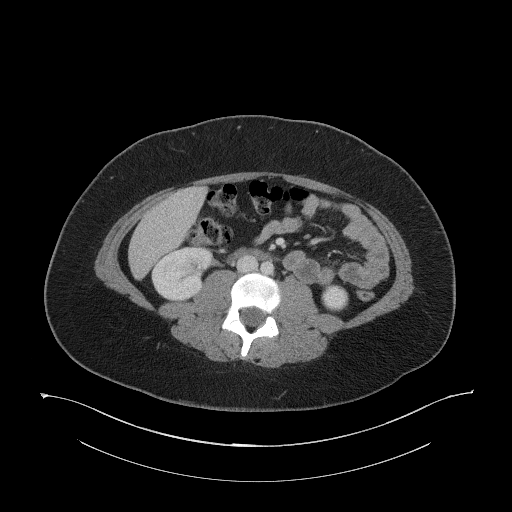
[im 64/109  bone]
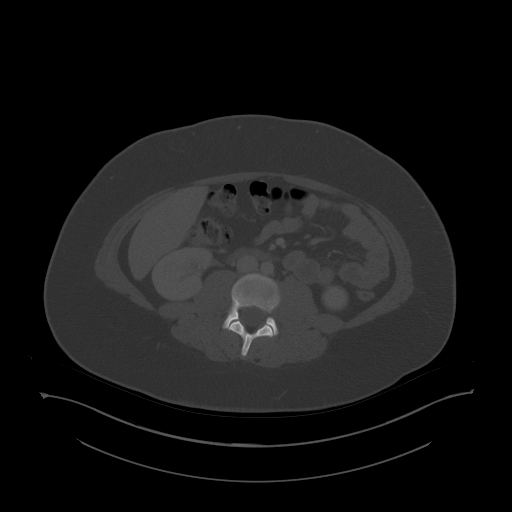
[im 73/109  soft-tissue]
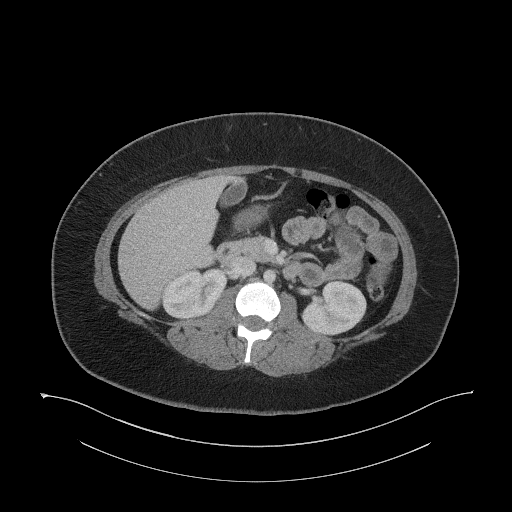
[im 82/109  soft-tissue]
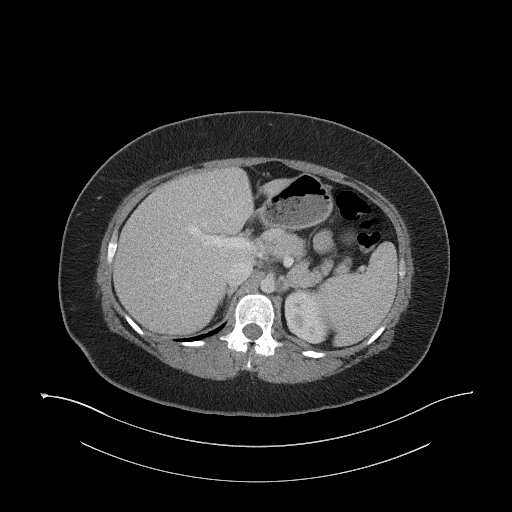
[im 86/109  soft-tissue]
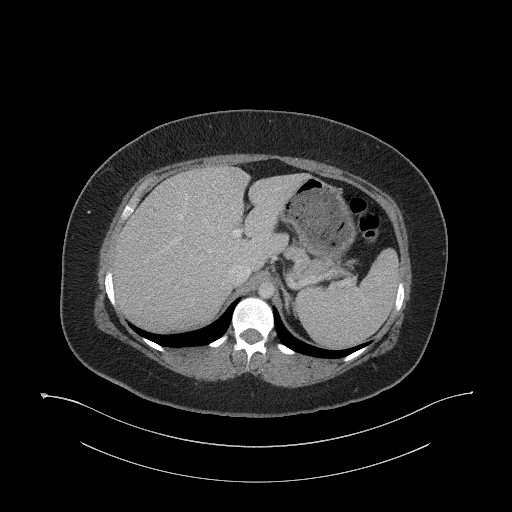
[im 95/109  soft-tissue]
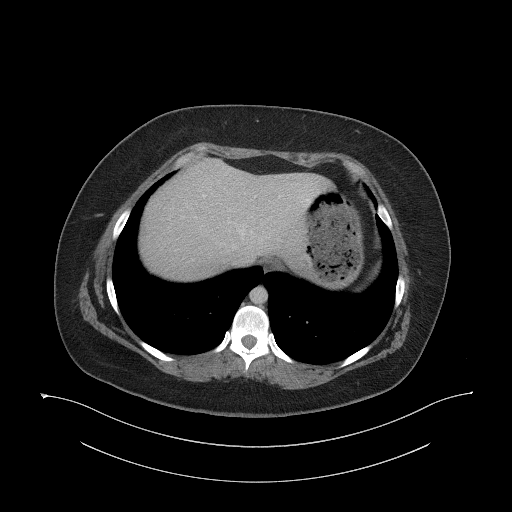
[im 104/109  soft-tissue]
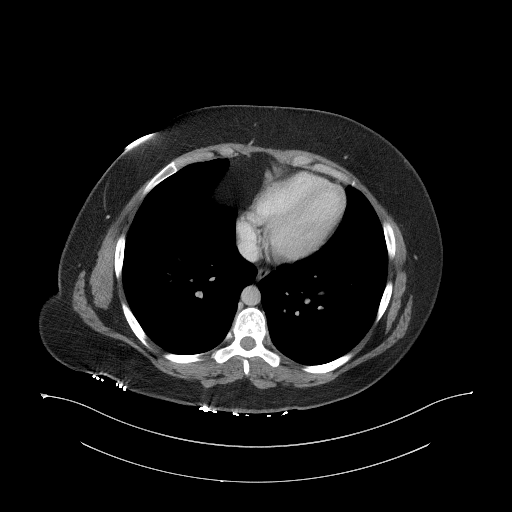

[Series 6: a/p w/ cor · coronal · 1.02mm/px · 3 of 177 slices shown]
[im 59/177  soft-tissue]
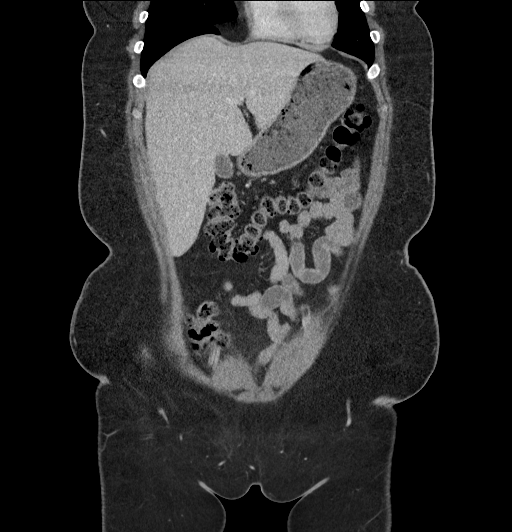
[im 79/177  soft-tissue]
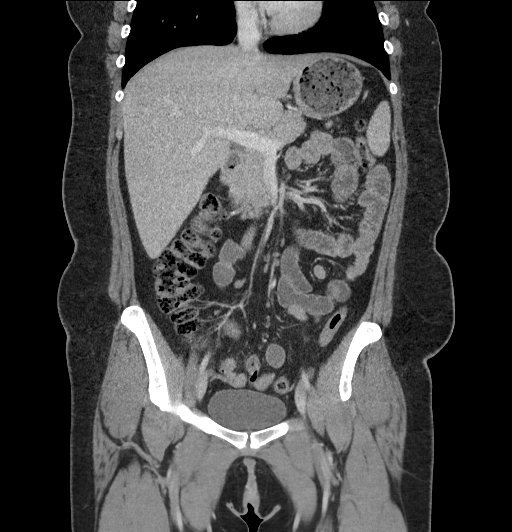
[im 98/177  soft-tissue]
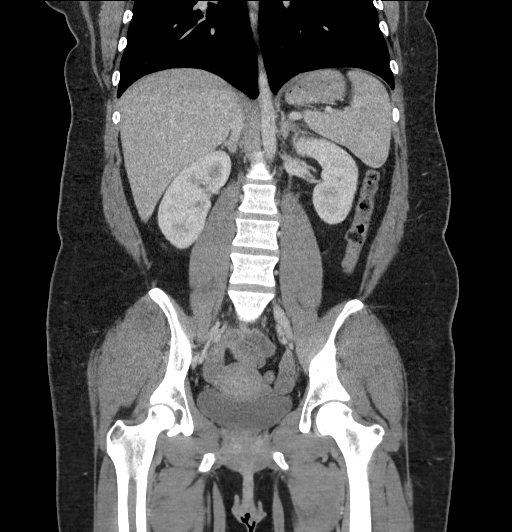

[17 of 46 positions shown; findings below may reference images not displayed]

FINDINGS: Lower Chest: No acute findings.

Hepatobiliary: No hepatic masses identified. Gallbladder is
unremarkable. No evidence of biliary ductal dilatation.

Pancreas:  No mass or inflammatory changes.

Spleen: Within normal limits in size and appearance.

Adrenals/Urinary Tract: No masses identified. No evidence of
ureteral calculi or hydronephrosis.

Stomach/Bowel: No evidence of obstruction, inflammatory process or
abnormal fluid collections.

Vascular/Lymphatic: No pathologically enlarged lymph nodes. No
abdominal aortic aneurysm.

Reproductive: Normal size uterus. No mass or other significant
abnormality. No evidence of inflammatory process, abscess, or free
fluid.

Other: No evidence of abdominal wall mass, fluid collection, or
hernia.

Musculoskeletal:  No suspicious bone lesions identified.
IMPRESSION: Negative. No acute findings or other significant abnormality
identified.
